# Patient Record
Sex: Female | Born: 1965 | Race: White | Hispanic: No | Marital: Married | Smoking: Never smoker
Health system: Southern US, Community
[De-identification: ages and names within clinical notes are randomized; demographics above are authoritative.]

## PROBLEM LIST (undated history)

## (undated) DIAGNOSIS — Z800 Family history of malignant neoplasm of digestive organs: Secondary | ICD-10-CM

## (undated) DIAGNOSIS — K589 Irritable bowel syndrome without diarrhea: Secondary | ICD-10-CM

## (undated) DIAGNOSIS — Z803 Family history of malignant neoplasm of breast: Secondary | ICD-10-CM

## (undated) HISTORY — DX: Family history of malignant neoplasm of breast: Z80.3

## (undated) HISTORY — PX: UTERINE SEPTUM RESECTION: SHX5386

## (undated) HISTORY — DX: Family history of malignant neoplasm of digestive organs: Z80.0

## (undated) HISTORY — DX: Irritable bowel syndrome without diarrhea: K58.9

---

## 1996-07-08 HISTORY — PX: CATARACT EXTRACTION: SUR2

## 1998-07-08 HISTORY — PX: DILATION AND CURETTAGE OF UTERUS: SHX78

## 2009-09-05 HISTORY — PX: BREAST BIOPSY: SHX20

## 2014-07-21 ENCOUNTER — Encounter: Payer: Self-pay | Admitting: *Deleted

## 2014-07-25 ENCOUNTER — Ambulatory Visit (INDEPENDENT_AMBULATORY_CARE_PROVIDER_SITE_OTHER): Payer: 59 | Admitting: General Surgery

## 2014-07-25 ENCOUNTER — Encounter: Payer: Self-pay | Admitting: General Surgery

## 2014-07-25 VITALS — BP 142/82 | HR 78 | Resp 15 | Ht 64.0 in | Wt 141.0 lb

## 2014-07-25 DIAGNOSIS — N644 Mastodynia: Secondary | ICD-10-CM

## 2014-07-25 NOTE — Progress Notes (Signed)
Patient ID: Carol Francis, female   DOB: 1966/01/31, 49 y.o.   MRN: [id number]  Chief Complaint  Patient presents with  . Other    HPI Carol Lombardnita M Swetz is a 49 y.o. female  Here for evaluation of left breast pain. She states she noticed this pain around 07/08/14. The pain has subsided since that time. It developed before her most recent menses, but did persist after the menses had ended. No lumps felt on self exam. Her most recent mammogram was performed on 10/28/13. The pain was located in the left breast behind the nipple. The pain was a shooting pain that came and went. No injuries to the breasts.   HPI  Past Medical History  Diagnosis Date  . IBS (irritable bowel syndrome)     Past Surgical History  Procedure Laterality Date  . Dilation and curettage of uterus  2000  . Cataract extraction  1998  . Breast biopsy Left 09/2009    Family History  Problem Relation Age of Onset  . Breast cancer Mother [id number]  . Cancer Paternal Uncle     colon    Social History History  Substance Use Topics  . Smoking status: Never Smoker   . Smokeless tobacco: Never Used  . Alcohol Use: No    Allergies  Allergen Reactions  . Other Swelling    [place]    No current outpatient prescriptions on file.   No current facility-administered medications for this visit.    Review of Systems Review of Systems  Constitutional: Negative.   Respiratory: Negative.   Cardiovascular: Negative.     Blood pressure 142/82, pulse 78, resp. rate 15, height 5\' 4"  (1.626 m), weight 141 lb (63.957 kg), last menstrual period 07/15/2014.  Physical Exam Physical Exam  Constitutional: She is oriented to person, place, and time. She appears well-developed and well-nourished.  Neck: Neck supple. No thyromegaly present.  Cardiovascular: Normal rate, regular rhythm and normal heart sounds.   No murmur heard. Pulmonary/Chest: Effort normal and breath sounds normal. Right breast exhibits no inverted nipple, no  mass, no nipple discharge, no skin change and no tenderness. Left breast exhibits no inverted nipple, no mass, no nipple discharge, no skin change and no tenderness.  Right breast larger than the left.   Lymphadenopathy:    She has no cervical adenopathy.    She has no axillary adenopathy.  Neurological: She is alert and oriented to person, place, and time.  Skin: Skin is warm and dry.    Data Reviewed The report of her 10/28/2013 mammogram reported extremely dense breast tissue. BI-RADS 1.  Assessment    Benign breast exam.    Plan    The patient was encouraged to call if she has recurrent symptoms. Follow up otherwise will be on an as needed basis.    Ref: Dr Arnold LongVan Dalen   Bond Grieshop W 07/26/2014, 8:14 PM

## 2014-07-25 NOTE — Patient Instructions (Signed)
Continue self breast exams. Call office for any new breast issues or concerns. 

## 2014-07-26 DIAGNOSIS — N644 Mastodynia: Secondary | ICD-10-CM | POA: Insufficient documentation

## 2015-11-20 ENCOUNTER — Ambulatory Visit
Admission: RE | Admit: 2015-11-20 | Discharge: 2015-11-20 | Disposition: A | Discharge status: Home / Self Care | Payer: 59 | Source: Ambulatory Visit | Attending: Family Medicine | Admitting: Family Medicine

## 2015-11-20 ENCOUNTER — Encounter: Payer: Self-pay | Admitting: Family Medicine

## 2015-11-20 ENCOUNTER — Ambulatory Visit (INDEPENDENT_AMBULATORY_CARE_PROVIDER_SITE_OTHER): Payer: 59 | Admitting: Family Medicine

## 2015-11-20 VITALS — BP 138/100 | HR 97 | Temp 98.1°F | Resp 16 | Wt 139.4 lb

## 2015-11-20 DIAGNOSIS — J0130 Acute sphenoidal sinusitis, unspecified: Secondary | ICD-10-CM | POA: Diagnosis not present

## 2015-11-20 DIAGNOSIS — J40 Bronchitis, not specified as acute or chronic: Secondary | ICD-10-CM

## 2015-11-20 MED ORDER — CEFDINIR 300 MG PO CAPS: 300.0000 mg | ORAL_CAPSULE | Freq: Two times a day (BID) | ORAL | Status: AC

## 2015-11-20 NOTE — Progress Notes (Signed)
Subjective:     Patient ID: Carol Francis, female   DOB: 1966/01/24, 50 y.o.   MRN: [id number]  HPI  Chief Complaint  Patient presents with  . Bronchitis    Patient comes into clinic today for follow up visit after being seen and evaluated at [facility] on 11/[facility]15 for cough. Patient reports for the past ten days she has had cough, body ache and chest congestion; for the pst 3 weeks patients reports that she has had symptoms of sinus pain and drainage. Patient reports that she was diagnosed [facility] with sinusitits and bronchitis, patient was prescribed Doxycyline. Patient is on last round of antibiotic and no improvement.  States on or about 5/6 she developed increased purulent sinus congestion, PND, body aches and increased chest congestion with cough. Reports she will be completing a 7 day course of the abx tomorrow with little improvement. Has been taking Sudafed for her sx. No hx of asthma.   Review of Systems     Objective:   Physical Exam  Constitutional: She appears well-developed and well-nourished. No distress.  Ears: T.M's intact without inflammation Sinuses: non-tender but localizes toward her parietal area. Throat: no tonsillar enlargement or exudate Neck: no cervical adenopathy Lungs: Bi-basilar coarse crackles.     Assessment:    1. Acute sphenoidal sinusitis, recurrence not specified - cefdinir (OMNICEF) 300 MG capsule; Take 1 capsule (300 mg total) by mouth 2 (two) times daily.  Dispense: 20 capsule; Refill: 0  2. Bronchitis - DG Chest 2 View; Future - cefdinir (OMNICEF) 300 MG capsule; Take 1 capsule (300 mg total) by mouth 2 (two) times daily.  Dispense: 20 capsule; Refill: 0    Plan:    Discussed otc medication. Further f/u pending x-ray report.

## 2015-11-20 NOTE — Patient Instructions (Signed)
Discussed use of Delsym for cough and expectorants. We will call you with the x-ray report.

## 2015-11-21 ENCOUNTER — Ambulatory Visit: Payer: Self-pay | Admitting: Family Medicine

## 2015-12-27 ENCOUNTER — Other Ambulatory Visit: Payer: Self-pay | Admitting: Obstetrics & Gynecology

## 2015-12-27 DIAGNOSIS — Z1231 Encounter for screening mammogram for malignant neoplasm of breast: Secondary | ICD-10-CM

## 2016-01-19 ENCOUNTER — Ambulatory Visit: Payer: 59

## 2016-01-29 ENCOUNTER — Ambulatory Visit: Payer: 59

## 2016-02-13 ENCOUNTER — Ambulatory Visit: Payer: 59

## 2016-02-20 ENCOUNTER — Other Ambulatory Visit: Payer: Self-pay | Admitting: Obstetrics & Gynecology

## 2016-02-20 ENCOUNTER — Ambulatory Visit
Admission: RE | Admit: 2016-02-20 | Discharge: 2016-02-20 | Disposition: A | Discharge status: Home / Self Care | Payer: 59 | Source: Ambulatory Visit | Attending: Obstetrics & Gynecology | Admitting: Obstetrics & Gynecology

## 2016-02-20 DIAGNOSIS — Z1231 Encounter for screening mammogram for malignant neoplasm of breast: Secondary | ICD-10-CM | POA: Diagnosis not present

## 2016-08-09 ENCOUNTER — Telehealth: Payer: Self-pay | Admitting: Family Medicine

## 2016-08-09 NOTE — Telephone Encounter (Signed)
Does sound like she is dehydrated, she should push fluids and see how she feels. She should finish the Tamiflu. She can try taking a Benadryl for the post nasal drip at night to stop her from coughing. Don't think we have appts today but if she feels significantly worse tomorrow she can make an appt for the half day clinic. But if she's not having fever, extremely productive cough with green sputum/nausea vomiting, sounds like it will run it's course. If she continue to feel dizzy and lightheaded and is unable to tolerate PO intake, she may need to go to the ER for IV fluids.

## 2016-08-09 NOTE — Telephone Encounter (Signed)
Patient states [facility] went to [facility] and was treated for flu and given Tamiflu. They did not check the test just treated her for her symptoms. At that time she had body ache, sore throat and was feeling better yesterday but then this morning when she woke up she felt worse. More achy and lightheaded. She did not get enough fluid in her yesterday she thinks. As I was speaking with patient she states she feels better now. Symptoms are post nasal drainage, runny nose, head congestion, cough slightly productive and mainly at night when she tries to lay down. She has been taking Ibuprofen and Sudafed also. She can not take Mucinex because it makes her dizzy. Is there anything else she needs to do or be seen again?-aa

## 2016-08-09 NOTE — Telephone Encounter (Signed)
[facility] and was Dx with the flu. Pt has been taking Tamaflu and was starting to feel better yesterday but today she feels worse and would like to speak with a nurse. Thanks TNP

## 2016-08-09 NOTE — Telephone Encounter (Signed)
Pt advised-aa 

## 2016-08-22 ENCOUNTER — Telehealth: Payer: Self-pay | Admitting: Family Medicine

## 2016-08-22 NOTE — Telephone Encounter (Signed)
Patient was advised as below and states that her friend who is a doctor suggested patient request CBC. I advised patient to come in office to be assessed first and any labs that need to be ordered can be done after visit. Appointment scheduled at 8:30 2/16.KW

## 2016-08-22 NOTE — Telephone Encounter (Addendum)
Triaged patient on the phone, she states that she was seen at [facility] two weeks ago and diagnosed with Flu virus and prescribed Tamiflu. Patient reports that she has finished medication but still has body aches, she states she didn't know if it was related to virus or not? Patient denies fever, reports dry cough  And drainage in back of her throat. Patient states at time she feels light headed but denies ear pain or dizziness. Patient has been taking otc Ibuprofen. Patient wants to know if these symptoms will pass since she had the flu or if she needs to be seen in office? KW  Patient request call back on her cellphone. KW

## 2016-08-22 NOTE — Telephone Encounter (Signed)
Pt was diagnosed with the flu 2 weeks ago.  She is still having body aches, a little cough but no fever.  Please advise [phone]  Thanks Barth Kirkseri

## 2016-08-22 NOTE — Telephone Encounter (Signed)
[id number] 

## 2016-08-22 NOTE — Telephone Encounter (Signed)
I have no information on the patient or on this [facility] visit. It sounds like she is getting over the flu. Would push fluids and Tylenol. One of the extenders can see her today or tomorrow if desired.

## 2016-08-23 ENCOUNTER — Ambulatory Visit: Payer: Self-pay | Admitting: Physician Assistant

## 2017-01-01 ENCOUNTER — Telehealth: Payer: Self-pay

## 2017-01-01 NOTE — Telephone Encounter (Signed)
Pt scheduled to be seen 01/02/17 8:30 with SDJ

## 2017-01-01 NOTE — Telephone Encounter (Signed)
Pt c/o vag itching.  Has had yeast inf in past but this isn't presenting like that.  Recently bought a house that has hard water and has been attributing sxs to that.  Now she has a green d/c and odor.  She got a one day tx of miconizole for tonight.  In the past she has to come in and get rx for terazol b/c monistat doesn't usually work for her.  Should she try this or be seen?  [phone]

## 2017-01-03 ENCOUNTER — Encounter: Payer: Self-pay | Admitting: Obstetrics and Gynecology

## 2017-01-03 ENCOUNTER — Telehealth: Payer: Self-pay | Admitting: Obstetrics and Gynecology

## 2017-01-03 ENCOUNTER — Ambulatory Visit (INDEPENDENT_AMBULATORY_CARE_PROVIDER_SITE_OTHER): Payer: 59 | Admitting: Obstetrics and Gynecology

## 2017-01-03 VITALS — BP 118/74 | Ht 64.0 in | Wt 138.0 lb

## 2017-01-03 DIAGNOSIS — B3731 Acute candidiasis of vulva and vagina: Secondary | ICD-10-CM

## 2017-01-03 DIAGNOSIS — B373 Candidiasis of vulva and vagina: Secondary | ICD-10-CM

## 2017-01-03 LAB — POCT WET PREP WITH KOH
Clue Cells Wet Prep HPF POC: NEGATIVE
KOH PREP POC: POSITIVE — AB
PH, VAGINAL: 5
TRICHOMONAS UA: NEGATIVE

## 2017-01-03 MED ORDER — TERCONAZOLE 0.4 % VA CREA: 1.0000 | TOPICAL_CREAM | Freq: Every day | VAGINAL | 0 refills | Status: AC

## 2017-01-03 NOTE — Telephone Encounter (Signed)
Pt is calling stating she just left from her appointment with us today and has a question about the prescription terrazol . Pt report she is suppose to start her mentrual cycle in the next couple of days please advise.

## 2017-01-03 NOTE — Progress Notes (Signed)
Obstetrics & Gynecology Office Visit   Chief Complaint  Patient presents with  . Vaginal Itching  . Vaginitis   History of Present Illness: Vaginitis: Patient complains of an abnormal vaginal discharge for 4 weeks. Vaginal symptoms include local irritation, odor and vulvar itching.Vulvar symptoms include local irritation, odor and vulvar itching.STI Risk: Very low risk of STD exposureDischarge described as: white, green and thick.Other associated symptoms: none.Menstrual pattern: She had been bleeding regularly. Contraception: none   Past Medical History:  Diagnosis Date  . IBS (irritable bowel syndrome)     Past Surgical History:  Procedure Laterality Date  . BREAST BIOPSY Left 09/2009  . CATARACT EXTRACTION  1998  . DILATION AND CURETTAGE OF UTERUS  2000    Gynecologic History: Patient's last menstrual period was 12/14/2016.  Obstetric History: [id number]  Family History  Problem Relation Age of Onset  . Breast cancer Mother [id number]  . Cancer Paternal Uncle        colon  . Heart disease Father   . Heart disease Brother     Social History   Social History  . Marital status: Married    Spouse name: N/A  . Number of children: N/A  . Years of education: N/A   Occupational History  . Not on file.   Social History Main Topics  . Smoking status: Never Smoker  . Smokeless tobacco: Never Used  . Alcohol use No  . Drug use: No  . Sexual activity: Yes    Birth control/ protection: None   Other Topics Concern  . Not on file   Social History Narrative  . No narrative on file    Allergies  Allergen Reactions  . Amitriptyline Hcl Other (See Comments)  . Other Swelling    [place]    Prior to Admission medications   Medication Sig Start Date End Date Taking? Authorizing Provider  cefdinir (OMNICEF) 300 MG capsule Take 1 capsule (300 mg total) by mouth 2 (two) times daily. Patient not taking: Reported on 01/03/2017 11/20/15   Anola Gurneyhauvin, Robert, PA  doxycycline  (VIBRA-TABS) 100 MG tablet TAKE 1 TABLET (100 MG TOTAL) BY MOUTH 2 (TWO) TIMES A DAY FOR 7 DAYS. NOT FOR PREGNANCY/LACTATION 11/14/15   [provider]  PREVIDENT 5000 SENSITIVE 1.1-5 % PSTE See admin instructions. 09/13/15   [provider]  psyllium (TGT PSYLLIUM FIBER) 0.52 g capsule Take by mouth.    [provider]  triamcinolone cream (KENALOG) 0.1 % APPLY TWICE DAILY TO RASH UNTIL CLEAR , THEN USE AS NEEDED FOR FLARES 09/13/15   [provider]    Review of Systems  Constitutional: Negative.   HENT: Negative.   Eyes: Negative.   Respiratory: Negative.   Cardiovascular: Negative.   Gastrointestinal: Negative.   Genitourinary: Negative.   Musculoskeletal: Negative.   Skin: Negative.   Neurological: Negative.   Psychiatric/Behavioral: Negative.      Physical Exam BP 118/74   Ht 5\' 4"  (1.626 m)   Wt 138 lb (62.6 kg)   LMP 12/14/2016   BMI 23.69 kg/m  Patient's last menstrual period was 12/14/2016. Physical Exam  Constitutional: She is oriented to person, place, and time. She appears well-developed and well-nourished. No distress.  Genitourinary: Uterus normal. Pelvic exam was performed with patient supine. There is no rash, tenderness, lesion or injury on the right labia. There is no rash, tenderness, lesion or injury on the left labia. Vagina exhibits no lesion. No tenderness or bleeding in the vagina. No signs of injury around  the vagina. Vaginal discharge found. Right adnexum does not display mass, does not display tenderness and does not display fullness. Left adnexum does not display mass, does not display tenderness and does not display fullness. Cervix does not exhibit motion tenderness, lesion, discharge or polyp.   Uterus is anteverted.  Eyes: EOM are normal. No scleral icterus.  Neck: Normal range of motion. Neck supple.  Cardiovascular: Normal rate and regular rhythm.   Pulmonary/Chest: Effort normal and breath sounds normal. No  respiratory distress. She has no wheezes. She has no rales.  Abdominal: Soft. Bowel sounds are normal. She exhibits no distension and no mass. There is no tenderness. There is no rebound and no guarding.  Musculoskeletal: Normal range of motion. She exhibits no edema.  Neurological: She is alert and oriented to person, place, and time. No cranial nerve deficit.  Skin: Skin is warm and dry. No erythema.  Psychiatric: She has a normal mood and affect. Her behavior is normal. Judgment normal.   Female chaperone present for pelvic and breast  portions of the physical exam  Wet Prep Trichomonas: negative Fungal elements: PRESENT Clue Cells: absent Vaginal pH: 5.0   Assessment: 51 y.o. [id number] female with candida vulvovaginitis  Plan: 1. Candidal vulvovaginitis - terconazole (TERAZOL 7) 0.4 % vaginal cream; Place 1 applicator vaginally at bedtime x 7 days.  Dispense: 45 g; Refill: 0 - POCT Wet Prep with KOH   Thomasene Mohair, MD 01/03/2017 8:53 AM

## 2017-01-03 NOTE — Telephone Encounter (Signed)
Pt is returning call wants to speak with Meriam SpragueBeverly about he medicine and would like you to call her cell phone

## 2017-01-03 NOTE — Telephone Encounter (Signed)
Trying to call pt, no answer. Please advise her when she calls back that this is okay and she still needs to take medication for issue. Thank you.

## 2017-01-04 ENCOUNTER — Other Ambulatory Visit: Payer: Self-pay | Admitting: Obstetrics and Gynecology

## 2017-01-04 DIAGNOSIS — B373 Candidiasis of vulva and vagina: Secondary | ICD-10-CM | POA: Insufficient documentation

## 2017-01-04 DIAGNOSIS — B3731 Acute candidiasis of vulva and vagina: Secondary | ICD-10-CM | POA: Insufficient documentation

## 2017-01-04 MED ORDER — TERCONAZOLE 0.8 % VA CREA: 1.0000 | TOPICAL_CREAM | Freq: Every day | VAGINAL | 0 refills | Status: AC

## 2017-01-20 ENCOUNTER — Ambulatory Visit (INDEPENDENT_AMBULATORY_CARE_PROVIDER_SITE_OTHER): Payer: 59 | Admitting: Obstetrics & Gynecology

## 2017-01-20 ENCOUNTER — Encounter: Payer: Self-pay | Admitting: Obstetrics & Gynecology

## 2017-01-20 VITALS — BP 130/90 | HR 67 | Ht 64.0 in | Wt 138.0 lb

## 2017-01-20 DIAGNOSIS — Z1211 Encounter for screening for malignant neoplasm of colon: Secondary | ICD-10-CM

## 2017-01-20 DIAGNOSIS — Z01419 Encounter for gynecological examination (general) (routine) without abnormal findings: Secondary | ICD-10-CM | POA: Diagnosis not present

## 2017-01-20 DIAGNOSIS — Z0000 Encounter for general adult medical examination without abnormal findings: Secondary | ICD-10-CM

## 2017-01-20 NOTE — Patient Instructions (Signed)
PAP every three years Mammogram every year Colonoscopy every 5 or 10 years Labs yearly (with PCP or work)

## 2017-01-20 NOTE — Progress Notes (Signed)
   HPI:      [name] is a 51 y.o. [id number] who LMP was Patient's last menstrual period was 01/08/2017., she presents today for her annual examination. The patient has no complaints today. The patient is sexually active. Her last pap: approximate date 2017 and was normal and last mammogram: was normal. The patient does perform self breast exams.  There is notable family history of breast or ovarian cancer in her family.  The patient has regular exercise: yes.  The patient denies current symptoms of depression.    GYN History: Contraception: vasectomy  PMHx: Past Medical History:  Diagnosis Date  . IBS (irritable bowel syndrome)    Past Surgical History:  Procedure Laterality Date  . BREAST BIOPSY Left 09/2009  . CATARACT EXTRACTION  1998  . DILATION AND CURETTAGE OF UTERUS  2000  . UTERINE SEPTUM RESECTION     Family History  Problem Relation Age of Onset  . Breast cancer Mother [id number]  . Fibromyalgia Mother   . Hypertension Mother   . Osteoporosis Mother   . Cancer Paternal Uncle        colon  . Heart disease Father   . Heart attack Father   . Stroke Father   . Heart disease Brother   . Hypertension Brother    Social History  Substance Use Topics  . Smoking status: Never Smoker  . Smokeless tobacco: Never Used  . Alcohol use No    Current Outpatient Prescriptions:  .  cefdinir (OMNICEF) 300 MG capsule, Take 1 capsule (300 mg total) by mouth 2 (two) times daily. (Patient not taking: Reported on 01/03/2017), Disp: 20 capsule, Rfl: 0 .  doxycycline (VIBRA-TABS) 100 MG tablet, TAKE 1 TABLET (100 MG TOTAL) BY MOUTH 2 (TWO) TIMES A DAY FOR 7 DAYS. NOT FOR PREGNANCY/LACTATION, Disp: , Rfl: 0 .  PREVIDENT 5000 SENSITIVE 1.1-5 % PSTE, See admin instructions., Disp: , Rfl: 1 .  psyllium (TGT PSYLLIUM FIBER) 0.52 g capsule, Take by mouth., Disp: , Rfl:  .  triamcinolone cream (KENALOG) 0.1 %, APPLY TWICE DAILY TO RASH UNTIL CLEAR , THEN USE AS NEEDED FOR FLARES, Disp: , Rfl:  1 Allergies: Amitriptyline hcl and Other  ROS  Objective: BP 130/90   Pulse 67   Ht 5\' 4"  (1.626 m)   Wt 138 lb (62.6 kg)   LMP 01/08/2017   BMI 23.69 kg/m   Filed Weights   01/20/17 1512  Weight: 138 lb (62.6 kg)   Body mass index is 23.69 kg/m. OBGyn Exam  Assessment:  ANNUAL EXAM 1. Annual physical exam   2. Screen for colon cancer      Screening Plan:            1.  Cervical Screening-  Pap smear schedule reviewed with patient, due 2020  2. Breast screening- Exam annually and mammogram>40 planned   3. Colonoscopy every 10 years, Hemoccult testing - after age 49  4. Labs done thru work, normal chol and other labs per pt  5. Counseling for contraception: vasectomy  Other:  1. Annual physical exam  2. Screen for colon cancer - Fecal occult blood, imunochemical  3. Joint pain, activity dependant it seems.  OA also discussed.  Exercise and even PT consult discussed.    F/U  Return in about 1 year (around 01/20/2018) for Annual.  Annamarie MajorPaul Harris, MD, Merlinda FrederickFACOG [facility], [facility] 01/20/2017  3:43 PM

## 2017-03-03 ENCOUNTER — Encounter: Payer: Self-pay | Admitting: Obstetrics & Gynecology

## 2017-06-27 ENCOUNTER — Encounter: Payer: Self-pay | Admitting: Advanced Practice Midwife

## 2017-06-27 ENCOUNTER — Ambulatory Visit (INDEPENDENT_AMBULATORY_CARE_PROVIDER_SITE_OTHER): Payer: 59 | Admitting: Advanced Practice Midwife

## 2017-06-27 VITALS — BP 138/98 | HR 55 | Ht 64.0 in | Wt 140.0 lb

## 2017-06-27 DIAGNOSIS — B3731 Acute candidiasis of vulva and vagina: Secondary | ICD-10-CM

## 2017-06-27 DIAGNOSIS — B373 Candidiasis of vulva and vagina: Secondary | ICD-10-CM | POA: Diagnosis not present

## 2017-06-27 MED ORDER — TERCONAZOLE 0.4 % VA CREA: 1.0000 | TOPICAL_CREAM | Freq: Every day | VAGINAL | 1 refills | Status: DC

## 2017-06-27 NOTE — Progress Notes (Signed)
S: The patient is here today with complaint of vaginal itching. She first noticed the symptoms 1 week ago of itching and a little bit of burning externally. She thinks she may have had a slight increase in her usual amount of discharge. She denies odor or burning with urination. She has used a previous prescription of Terazol 3 for the past 3 nights with some improvement but she is still having symptoms. She has also previously used a 7 day treatment with good results. Today she is requesting an Rx for Terconazole 7 day treatment. Diflucan has not worked for her in the past.   O: BP (!) 138/98   Pulse (!) 55   Ht 5\' 4"  (1.626 m)   Wt 140 lb (63.5 kg)   LMP 06/15/2017   BMI 24.03 kg/m   No exam done due to patient having used the Terconazole cream for the past 3 nights.  A: 51 yo female with vaginal yeast infection  P: Return to clinic PRN for worsening symptoms OTC and other comfort measures for yeast symptoms  Tresea MallJane Sloan Galentine, CNM

## 2017-12-25 ENCOUNTER — Other Ambulatory Visit: Payer: Self-pay | Admitting: Gastroenterology

## 2017-12-25 DIAGNOSIS — R1011 Right upper quadrant pain: Secondary | ICD-10-CM

## 2017-12-29 ENCOUNTER — Ambulatory Visit
Admission: RE | Admit: 2017-12-29 | Discharge: 2017-12-29 | Disposition: A | Discharge status: Home / Self Care | Payer: Managed Care, Other (non HMO) | Source: Ambulatory Visit | Attending: Gastroenterology | Admitting: Gastroenterology

## 2017-12-29 DIAGNOSIS — R748 Abnormal levels of other serum enzymes: Secondary | ICD-10-CM | POA: Diagnosis present

## 2017-12-29 DIAGNOSIS — R1011 Right upper quadrant pain: Secondary | ICD-10-CM | POA: Diagnosis present

## 2018-01-14 ENCOUNTER — Telehealth: Payer: Self-pay

## 2018-01-14 NOTE — Telephone Encounter (Signed)
Spoke w/pt. Chart reviewed. No pap done last year. Advised pt of Standards of care q3year paps after 50yo if no h/o abn. Pt requesting pap annually. OB/GYN [facility](Blue Sticky Note) added to document for future reference.

## 2018-01-14 NOTE — Telephone Encounter (Signed)
Pt inquiring if she had or qualified for cervical screening last year. She is eligible for contributions to her health care account for wellness screenings. She did not receive her contribution last year. She is wanting to clarify. [id number]#[phone]

## 2018-02-04 ENCOUNTER — Encounter: Payer: Self-pay | Admitting: Obstetrics & Gynecology

## 2018-02-04 ENCOUNTER — Ambulatory Visit (INDEPENDENT_AMBULATORY_CARE_PROVIDER_SITE_OTHER): Payer: Managed Care, Other (non HMO) | Admitting: Obstetrics & Gynecology

## 2018-02-04 VITALS — BP 128/80 | Ht 64.0 in | Wt 138.0 lb

## 2018-02-04 DIAGNOSIS — Z1231 Encounter for screening mammogram for malignant neoplasm of breast: Secondary | ICD-10-CM

## 2018-02-04 DIAGNOSIS — Z124 Encounter for screening for malignant neoplasm of cervix: Secondary | ICD-10-CM

## 2018-02-04 DIAGNOSIS — Z01419 Encounter for gynecological examination (general) (routine) without abnormal findings: Secondary | ICD-10-CM

## 2018-02-04 DIAGNOSIS — Z803 Family history of malignant neoplasm of breast: Secondary | ICD-10-CM | POA: Diagnosis not present

## 2018-02-04 DIAGNOSIS — Z1211 Encounter for screening for malignant neoplasm of colon: Secondary | ICD-10-CM

## 2018-02-04 DIAGNOSIS — Z0000 Encounter for general adult medical examination without abnormal findings: Secondary | ICD-10-CM

## 2018-02-04 DIAGNOSIS — Z1239 Encounter for other screening for malignant neoplasm of breast: Secondary | ICD-10-CM

## 2018-02-04 NOTE — Patient Instructions (Addendum)
PAP every one to three years Mammogram every year    Call [phone] [facility] Colonoscopy every 5 years Labs yearly (with PCP)

## 2018-02-04 NOTE — Progress Notes (Signed)
HPI:      [name] is a 52 y.o. [id number] who LMP was in the past, she presents today for her annual examination.  The patient has no complaints today.   Reg periods usually. The patient is sexually active. Herlast pap: approximate date 2018 and was normal and last mammogram: approximate date 2018 and was normal.  The patient does not perform self breast exams.  There is notable family history of breast or ovarian cancer in her family. The patient is not taking hormone replacement therapy. Patient denies post-menopausal vaginal bleeding.   The patient has regular exercise: yes. The patient denies current symptoms of depression.    GYN Hx: Last Colonoscopy:never ago.   PMHx: Past Medical History:  Diagnosis Date  . IBS (irritable bowel syndrome)    Past Surgical History:  Procedure Laterality Date  . BREAST BIOPSY Left 09/2009  . CATARACT EXTRACTION  1998  . DILATION AND CURETTAGE OF UTERUS  2000  . UTERINE SEPTUM RESECTION     Family History  Problem Relation Age of Onset  . Breast cancer Mother 82  . Fibromyalgia Mother   . Hypertension Mother   . Osteoporosis Mother   . Cancer Paternal Uncle        colon  . Heart disease Father   . Heart attack Father   . Stroke Father   . Heart disease Brother   . Hypertension Brother    Social History   Tobacco Use  . Smoking status: Never Smoker  . Smokeless tobacco: Never Used  Substance Use Topics  . Alcohol use: No    Alcohol/week: 0.0 oz  . Drug use: No    Current Outpatient Medications:  .  cefdinir (OMNICEF) 300 MG capsule, Take 1 capsule (300 mg total) by mouth 2 (two) times daily., Disp: 20 capsule, Rfl: 0 .  PREVIDENT 5000 SENSITIVE 1.1-5 % PSTE, See admin instructions., Disp: , Rfl: 1 .  psyllium (TGT PSYLLIUM FIBER) 0.52 g capsule, Take by mouth., Disp: , Rfl:  .  terconazole (TERAZOL 7) 0.4 % vaginal cream, Place 1 applicator vaginally at bedtime., Disp: 45 g, Rfl: 1 .  triamcinolone cream (KENALOG) 0.1 %,  APPLY TWICE DAILY TO RASH UNTIL CLEAR , THEN USE AS NEEDED FOR FLARES, Disp: , Rfl: 1 Allergies: Amitriptyline hcl and Other  Review of Systems  Constitutional: Negative for chills, fever and malaise/fatigue.  HENT: Negative for congestion, sinus pain and sore throat.   Eyes: Negative for blurred vision and pain.  Respiratory: Negative for cough and wheezing.   Cardiovascular: Negative for chest pain and leg swelling.  Gastrointestinal: Negative for abdominal pain, constipation, diarrhea, heartburn, nausea and vomiting.  Genitourinary: Negative for dysuria, frequency, hematuria and urgency.  Musculoskeletal: Negative for back pain, joint pain, myalgias and neck pain.  Skin: Negative for itching and rash.  Neurological: Negative for dizziness, tremors and weakness.  Endo/Heme/Allergies: Does not bruise/bleed easily.  Psychiatric/Behavioral: Negative for depression. The patient is not nervous/anxious and does not have insomnia.     Objective: BP 128/80   Ht 5\' 4"  (1.626 m)   Wt 138 lb (62.6 kg)   LMP 01/14/2018   BMI 23.69 kg/m   Filed Weights   02/04/18 1529  Weight: 138 lb (62.6 kg)   Body mass index is 23.69 kg/m. Physical Exam  Constitutional: She is oriented to person, place, and time. She appears well-developed and well-nourished. No distress.  Genitourinary: Rectum normal, vagina normal and uterus normal. Pelvic exam was performed  with patient supine. There is no rash or lesion on the right labia. There is no rash or lesion on the left labia. Vagina exhibits no lesion. No bleeding in the vagina. Right adnexum does not display mass and does not display tenderness. Left adnexum does not display mass and does not display tenderness. Cervix does not exhibit motion tenderness, lesion, friability or polyp.   Uterus is mobile and midaxial. Uterus is not enlarged or exhibiting a mass.  HENT:  Head: Normocephalic and atraumatic. Head is without laceration.  Right Ear: Hearing normal.   Left Ear: Hearing normal.  Nose: No epistaxis.  No foreign bodies.  Mouth/Throat: Uvula is midline, oropharynx is clear and moist and mucous membranes are normal.  Eyes: Pupils are equal, round, and reactive to light.  Neck: Normal range of motion. Neck supple. No thyromegaly present.  Cardiovascular: Normal rate and regular rhythm. Exam reveals no gallop and no friction rub.  No murmur heard. Pulmonary/Chest: Effort normal and breath sounds normal. No respiratory distress. She has no wheezes. Right breast exhibits no mass, no skin change and no tenderness. Left breast exhibits no mass, no skin change and no tenderness.  Abdominal: Soft. Bowel sounds are normal. She exhibits no distension. There is no tenderness. There is no rebound.  Musculoskeletal: Normal range of motion.  Neurological: She is alert and oriented to person, place, and time. No cranial nerve deficit.  Skin: Skin is warm and dry.  Psychiatric: She has a normal mood and affect. Judgment normal.  Vitals reviewed.  Assessment: Annual Exam 1. Annual physical exam   2. Screening for cervical cancer   3. Screening for breast cancer   4. Screen for colon cancer    Plan:            1.  Cervical Screening-  Pap smear done today  2. Breast screening- Exam annually and mammogram scheduled 3D due to breast density history  3. Colonoscopy every 5 years ([facility]), Hemoccult testing after age 50  4. Labs managed by PCP  5. Counseling for hormonal therapy: none.  Monitor for s/sx menopause.     F/U  Return in about 1 year (around 02/05/2019) for Annual.  Annamarie MajorPaul Avaleen Brownley, MD, Merlinda FrederickFACOG [facility], [facility] 02/04/2018  4:03 PM

## 2018-02-11 LAB — PAP IG (IMAGE GUIDED): PAP Smear Comment: 0

## 2018-02-20 ENCOUNTER — Other Ambulatory Visit: Payer: Self-pay | Admitting: Obstetrics & Gynecology

## 2018-02-20 DIAGNOSIS — R195 Other fecal abnormalities: Secondary | ICD-10-CM

## 2018-02-20 LAB — FECAL OCCULT BLOOD, IMMUNOCHEMICAL: Fecal Occult Bld: POSITIVE — AB

## 2018-02-20 NOTE — Progress Notes (Signed)
Positive Hemoccult stool test for heme, rec GI referral Past colonoscopy 3 years ago w Dr Markham JordanElliot Referral requested D/w pt  Annamarie MajorPaul Ardian Haberland, MD, Merlinda FrederickFACOG [facility], [facility] 02/20/2018  11:57 AM

## 2018-03-04 ENCOUNTER — Ambulatory Visit
Admission: RE | Admit: 2018-03-04 | Discharge: 2018-03-04 | Disposition: A | Discharge status: Home / Self Care | Payer: Managed Care, Other (non HMO) | Source: Ambulatory Visit | Attending: Obstetrics & Gynecology | Admitting: Obstetrics & Gynecology

## 2018-03-04 DIAGNOSIS — Z1239 Encounter for other screening for malignant neoplasm of breast: Secondary | ICD-10-CM

## 2018-03-04 DIAGNOSIS — Z1231 Encounter for screening mammogram for malignant neoplasm of breast: Secondary | ICD-10-CM | POA: Insufficient documentation

## 2018-05-15 ENCOUNTER — Encounter: Payer: Self-pay | Admitting: Family Medicine

## 2018-11-25 ENCOUNTER — Telehealth: Payer: Self-pay

## 2018-11-25 NOTE — Telephone Encounter (Signed)
Pt is requesting RF on XANAX 0.25 mg. She says last time she had it prescribed was about four years ago by Dr. Arvil Chaco (30 tablets). She has a lot of pressure in her life now, can you please RF?

## 2018-11-25 NOTE — Telephone Encounter (Signed)
I have asked Raynelle Fanning to call pt to schedule.

## 2018-11-25 NOTE — Telephone Encounter (Signed)
I have not seen this patient for nearly 2 years. She last saw Dr. Tiburcio Pea. I am happy to do a telephone appt with her and prescribe the medication, if it seems like it would be beneficial.  But, I can not prescribe her a medication for something for which I have never evaluated her.  Please let her know and see if she'd like to do the phone appt.

## 2018-11-25 NOTE — Telephone Encounter (Signed)
Patient scheduled 5/26 for phone visit with SDJ

## 2018-12-01 ENCOUNTER — Encounter: Payer: Self-pay | Admitting: Obstetrics and Gynecology

## 2018-12-01 ENCOUNTER — Other Ambulatory Visit: Payer: Self-pay

## 2018-12-01 ENCOUNTER — Ambulatory Visit (INDEPENDENT_AMBULATORY_CARE_PROVIDER_SITE_OTHER): Payer: Managed Care, Other (non HMO) | Admitting: Obstetrics and Gynecology

## 2018-12-01 VITALS — Ht 64.0 in | Wt 139.0 lb

## 2018-12-01 DIAGNOSIS — F411 Generalized anxiety disorder: Secondary | ICD-10-CM

## 2018-12-01 MED ORDER — ALPRAZOLAM 0.25 MG PO TABS: 0.2500 mg | ORAL_TABLET | Freq: Every day | ORAL | 0 refills | Status: DC | PRN

## 2018-12-01 NOTE — Progress Notes (Signed)
    Virtual Visit via Telephone Note  I connected with Carol Francis on 12/01/18 at  9:50 AM EDT by telephone and verified that I am speaking with the correct person using two identifiers.   I discussed the limitations, risks, security and privacy concerns of performing an evaluation and management service by telephone and the availability of in person appointments. I also discussed with the patient that there may be a patient responsible charge related to this service. The patient expressed understanding and agreed to proceed.  The patient was at home I spoke with the patient from my  office The names of people involved in this encounter were: Carol Francis and Thomasene Mohair, MD.   History of Present Illness: 53 y.o. [phone] female who presents with anxiety issues.  She has had some anxiety issues on and off since her second child.  She has taken the lowest dose and the last time she had a prescription was in 2016 and still has a few left. She has some new life stressors. Her child is home from college. Her younger child is in high school. She has a new boss and is transitioning to working from home. 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  She has had some mild panic-attack like symptoms.  She has also had difficulty sleeping due to racing thoughts.  She normally doesn't take the medication during the daytime.    GAD-7: 9   Observations/Objective: Physical Exam could not be performed. Because of the COVID-19 outbreak this visit was performed over the phone and not in person.   Assessment and Plan: 1. Generalized anxiety disorder   Rx for Xanax  Will give Trazodone, if she finds she is using the medication for sleep frequently.  PMDP checked and there are no recent prescriptions for any benzodiazepines for her.    Follow Up Instructions: Routine annual in July or as needed.    I discussed the assessment and treatment plan with the patient. The  patient was provided an opportunity to ask questions and all were answered. The patient agreed with the plan and demonstrated an understanding of the instructions.   The patient was advised to call back or seek an in-person evaluation if the symptoms worsen or if the condition fails to improve as anticipated.  I provided 18 minutes of non-face-to-face time during this encounter.  Thomasene Mohair, MD  [facility], [facility] 12/01/2018 9:26 AM

## 2019-03-03 ENCOUNTER — Other Ambulatory Visit: Payer: Self-pay | Admitting: Family Medicine

## 2019-03-03 ENCOUNTER — Other Ambulatory Visit: Payer: Self-pay

## 2019-03-03 ENCOUNTER — Other Ambulatory Visit: Payer: Self-pay | Admitting: Obstetrics & Gynecology

## 2019-03-03 DIAGNOSIS — Z1231 Encounter for screening mammogram for malignant neoplasm of breast: Secondary | ICD-10-CM

## 2019-03-24 ENCOUNTER — Encounter: Payer: Self-pay | Admitting: Obstetrics & Gynecology

## 2019-03-24 ENCOUNTER — Other Ambulatory Visit: Payer: Self-pay

## 2019-03-24 ENCOUNTER — Ambulatory Visit (INDEPENDENT_AMBULATORY_CARE_PROVIDER_SITE_OTHER): Payer: Managed Care, Other (non HMO) | Admitting: Obstetrics & Gynecology

## 2019-03-24 VITALS — BP 140/90 | Ht 64.0 in | Wt 139.0 lb

## 2019-03-24 DIAGNOSIS — Z124 Encounter for screening for malignant neoplasm of cervix: Secondary | ICD-10-CM

## 2019-03-24 DIAGNOSIS — Z1239 Encounter for other screening for malignant neoplasm of breast: Secondary | ICD-10-CM

## 2019-03-24 DIAGNOSIS — Z1211 Encounter for screening for malignant neoplasm of colon: Secondary | ICD-10-CM

## 2019-03-24 DIAGNOSIS — Z01419 Encounter for gynecological examination (general) (routine) without abnormal findings: Secondary | ICD-10-CM | POA: Diagnosis not present

## 2019-03-24 MED ORDER — TRIAMCINOLONE ACETONIDE 0.1 % EX CREA: 1.0000 "application " | TOPICAL_CREAM | Freq: Two times a day (BID) | CUTANEOUS | 1 refills | Status: DC

## 2019-03-24 NOTE — Progress Notes (Signed)
HPI:      [name] is a 53 y.o. [id number] who LMP was Patient's last menstrual period was 01/16/2019., she presents today for her annual examination. The patient has no complaints today. Periods were regular until 6 mos ago, spacing out.  Rare night sweat.  Occas vaginal dryness, no dyspareunia. The patient is sexually active. Her last pap: approximate date 2019 and was normal and last mammogram: approximate date 2019 and was normal. The patient does perform self breast exams.  There is notable family history of breast or ovarian cancer in her family.  The patient has regular exercise: yes.  The patient denies current symptoms of depression.    GYN History: Contraception: vasectomy  Colonoscopy last year normal  PMHx: Past Medical History:  Diagnosis Date  . IBS (irritable bowel syndrome)    Past Surgical History:  Procedure Laterality Date  . BREAST BIOPSY Left 09/2009   benign  . CATARACT EXTRACTION  1998  . DILATION AND CURETTAGE OF UTERUS  2000  . UTERINE SEPTUM RESECTION     Family History  Problem Relation Age of Onset  . Breast cancer Mother 1149  . Fibromyalgia Mother   . Hypertension Mother   . Osteoporosis Mother   . Cancer Paternal Uncle        colon  . Heart disease Father   . Heart attack Father   . Stroke Father   . Heart disease Brother   . Hypertension Brother    Social History   Tobacco Use  . Smoking status: Never Smoker  . Smokeless tobacco: Never Used  Substance Use Topics  . Alcohol use: No    Alcohol/week: 0.0 standard drinks  . Drug use: No    Current Outpatient Medications:  .  ALPRAZolam (XANAX) 0.25 MG tablet, Take 1 tablet (0.25 mg total) by mouth daily as needed for anxiety. (Patient not taking: Reported on 03/24/2019), Disp: 30 tablet, Rfl: 0 .  cefdinir (OMNICEF) 300 MG capsule, Take 1 capsule (300 mg total) by mouth 2 (two) times daily. (Patient not taking: Reported on 12/01/2018), Disp: 20 capsule, Rfl: 0 .  PREVIDENT 5000  SENSITIVE 1.1-5 % PSTE, See admin instructions., Disp: , Rfl: 1 .  psyllium (TGT PSYLLIUM FIBER) 0.52 g capsule, Take by mouth., Disp: , Rfl:  .  terconazole (TERAZOL 7) 0.4 % vaginal cream, Place 1 applicator vaginally at bedtime. (Patient not taking: Reported on 12/01/2018), Disp: 45 g, Rfl: 1 .  triamcinolone cream (KENALOG) 0.1 %, Apply 1 application topically 2 (two) times daily., Disp: 30 g, Rfl: 1 Allergies: Amitriptyline hcl and Other  Review of Systems  Constitutional: Negative for chills, fever and malaise/fatigue.  HENT: Negative for congestion, sinus pain and sore throat.   Eyes: Negative for blurred vision and pain.  Respiratory: Negative for cough and wheezing.   Cardiovascular: Negative for chest pain and leg swelling.  Gastrointestinal: Negative for abdominal pain, constipation, diarrhea, heartburn, nausea and vomiting.  Genitourinary: Negative for dysuria, frequency, hematuria and urgency.  Musculoskeletal: Negative for back pain, joint pain, myalgias and neck pain.  Skin: Negative for itching and rash.  Neurological: Negative for dizziness, tremors and weakness.  Endo/Heme/Allergies: Does not bruise/bleed easily.  Psychiatric/Behavioral: Negative for depression. The patient is not nervous/anxious and does not have insomnia.     Objective: BP 140/90   Ht 5\' 4"  (1.626 m)   Wt 139 lb (63 kg)   LMP 01/16/2019   BMI 23.86 kg/m   [employer]iled Weights  03/24/19 1439  Weight: 139 lb (63 kg)   Body mass index is 23.86 kg/m. Physical Exam Constitutional:      General: She is not in acute distress.    Appearance: She is well-developed.  Genitourinary:     Pelvic exam was performed with patient supine.     Vagina, uterus and rectum normal.     No lesions in the vagina.     No vaginal bleeding.     No cervical motion tenderness, friability, lesion or polyp.     Uterus is mobile.     Uterus is not enlarged.     No uterine mass detected.    Uterus is midaxial.     No right  or left adnexal mass present.     Right adnexa not tender.     Left adnexa not tender.  HENT:     Head: Normocephalic and atraumatic. No laceration.     Right Ear: Hearing normal.     Left Ear: Hearing normal.     Mouth/Throat:     Pharynx: Uvula midline.  Eyes:     Pupils: Pupils are equal, round, and reactive to light.  Neck:     Musculoskeletal: Normal range of motion and neck supple.     Thyroid: No thyromegaly.  Cardiovascular:     Rate and Rhythm: Normal rate and regular rhythm.     Heart sounds: No murmur. No friction rub. No gallop.   Pulmonary:     Effort: Pulmonary effort is normal. No respiratory distress.     Breath sounds: Normal breath sounds. No wheezing.  Chest:     Breasts:        Right: No mass, skin change or tenderness.        Left: No mass, skin change or tenderness.  Abdominal:     General: Bowel sounds are normal. There is no distension.     Palpations: Abdomen is soft.     Tenderness: There is no abdominal tenderness. There is no rebound.  Musculoskeletal: Normal range of motion.  Neurological:     Mental Status: She is alert and oriented to person, place, and time.     Cranial Nerves: No cranial nerve deficit.  Skin:    General: Skin is warm and dry.  Psychiatric:        Judgment: Judgment normal.  Vitals signs reviewed.     Assessment:  ANNUAL EXAM 1. Women's annual routine gynecological examination   2. Screening for cervical cancer   3. Screening for breast cancer   4. Screen for colon cancer      Screening Plan:            1.  Cervical Screening-  Pap smear done today  2. Breast screening- Exam annually and mammogram planned Sept 25  3. Colonoscopy every 5 years, Hemoccult testing - after age 81  4. Labs managed by PCP  5. Counseling for contraception: vasectomy   6. Replens for vaginal dryness. Monitor for other menopausal sx's.     F/U  Return in about 1 year (around 03/23/2020) for Annual.  Barnett Applebaum, MD, [facility], [facility] 03/24/2019  3:16 PM

## 2019-03-24 NOTE — Patient Instructions (Signed)
PAP every 1-3 years Mammogram every year    Call [phone] to schedule at [facility] Colonoscopy every 5 years Labs yearly (with PCP)  Replens for vaginal dryness, twice weekly

## 2019-03-27 LAB — PAP IG (IMAGE GUIDED)

## 2019-04-02 ENCOUNTER — Ambulatory Visit
Admission: RE | Admit: 2019-04-02 | Discharge: 2019-04-02 | Disposition: A | Discharge status: Home / Self Care | Payer: Managed Care, Other (non HMO) | Source: Ambulatory Visit | Attending: Obstetrics & Gynecology | Admitting: Obstetrics & Gynecology

## 2019-04-02 DIAGNOSIS — Z1231 Encounter for screening mammogram for malignant neoplasm of breast: Secondary | ICD-10-CM | POA: Diagnosis present

## 2020-02-08 ENCOUNTER — Other Ambulatory Visit: Payer: Self-pay | Admitting: Obstetrics & Gynecology

## 2020-03-21 ENCOUNTER — Other Ambulatory Visit: Payer: Self-pay | Admitting: Obstetrics & Gynecology

## 2020-03-21 DIAGNOSIS — Z1231 Encounter for screening mammogram for malignant neoplasm of breast: Secondary | ICD-10-CM

## 2020-03-27 ENCOUNTER — Ambulatory Visit (INDEPENDENT_AMBULATORY_CARE_PROVIDER_SITE_OTHER): Payer: Managed Care, Other (non HMO) | Admitting: Obstetrics & Gynecology

## 2020-03-27 ENCOUNTER — Other Ambulatory Visit: Payer: Self-pay

## 2020-03-27 ENCOUNTER — Other Ambulatory Visit (HOSPITAL_COMMUNITY)
Admission: RE | Admit: 2020-03-27 | Discharge: 2020-03-27 | Disposition: A | Discharge status: Home / Self Care | Payer: Managed Care, Other (non HMO) | Source: Ambulatory Visit | Attending: Obstetrics & Gynecology | Admitting: Obstetrics & Gynecology

## 2020-03-27 ENCOUNTER — Encounter: Payer: Self-pay | Admitting: Obstetrics & Gynecology

## 2020-03-27 VITALS — BP 140/98 | Ht 64.0 in | Wt 136.0 lb

## 2020-03-27 DIAGNOSIS — Z1231 Encounter for screening mammogram for malignant neoplasm of breast: Secondary | ICD-10-CM

## 2020-03-27 DIAGNOSIS — Z1211 Encounter for screening for malignant neoplasm of colon: Secondary | ICD-10-CM

## 2020-03-27 DIAGNOSIS — Z124 Encounter for screening for malignant neoplasm of cervix: Secondary | ICD-10-CM | POA: Diagnosis not present

## 2020-03-27 DIAGNOSIS — Z01419 Encounter for gynecological examination (general) (routine) without abnormal findings: Secondary | ICD-10-CM | POA: Diagnosis not present

## 2020-03-27 DIAGNOSIS — N952 Postmenopausal atrophic vaginitis: Secondary | ICD-10-CM

## 2020-03-27 DIAGNOSIS — E7800 Pure hypercholesterolemia, unspecified: Secondary | ICD-10-CM

## 2020-03-27 DIAGNOSIS — R1900 Intra-abdominal and pelvic swelling, mass and lump, unspecified site: Secondary | ICD-10-CM

## 2020-03-27 MED ORDER — REPLENS VA GEL: 1.0000 | VAGINAL | 11 refills | Status: DC

## 2020-03-27 NOTE — Patient Instructions (Addendum)
PAP every year Mammogram every year    Call [phone] to schedule at [facility] Colonoscopy every 10 years Labs follow up Nov  Thank you for choosing [facility]. As part of our ongoing efforts to improve patient experience, we would appreciate your feedback. Please fill out the short survey that you will receive by mail or MyChart. Your opinion is important to [place]! - Dr. Tiburcio Pea   Atrophic Vaginitis  Atrophic vaginitis is a condition in which the tissues that line the vagina become dry and thin. This condition is most common in women who have stopped having regular menstrual periods (are in menopause). This usually starts when a woman is 42-72 years old. That is the time when a woman's estrogen levels begin to drop (decrease). Estrogen is a female hormone. It helps to keep the tissues of the vagina moist. It stimulates the vagina to produce a clear fluid that lubricates the vagina for sexual intercourse. This fluid also protects the vagina from infection. Lack of estrogen can cause the lining of the vagina to get thinner and dryer. The vagina may also shrink in size. It may become less elastic. Atrophic vaginitis tends to get worse over time as a woman's estrogen level drops. What are the causes? This condition is caused by the normal drop in estrogen that happens around the time of menopause. What increases the risk? Certain conditions or situations may lower a woman's estrogen level, leading to a higher risk for atrophic vaginitis. You are more likely to develop this condition if:  You are taking medicines that block estrogen.  You have had your ovaries removed.  You are being treated for cancer with X-ray (radiation) or medicines (chemotherapy).  You have given birth or are breastfeeding.  You are older than age 39.  You smoke. What are the signs or symptoms? Symptoms of this condition include:  Pain, soreness, or bleeding during sexual intercourse (dyspareunia).  Vaginal  burning, irritation, or itching.  Pain or bleeding when a speculum is used in a vaginal exam (pelvic exam).  Having burning pain when passing urine.  Vaginal discharge that is brown or yellow. In some cases, there are no symptoms. How is this diagnosed? This condition is diagnosed by taking a medical history and doing a physical exam. This will include a pelvic exam that checks the vaginal tissues. Though rare, you may also have other tests, including:  A urine test.  A test that checks the acid balance in your vagina (acid balance test). How is this treated? Treatment for this condition depends on how severe your symptoms are. Treatment may include:  Using an over-the-counter vaginal lubricant before sex.  Using a long-acting vaginal moisturizer.  REPLENS  Using low-dose vaginal estrogen for moderate to severe symptoms that do not respond to other treatments. Options include creams, tablets, and inserts (vaginal rings). Before you use a vaginal estrogen, tell your health care provider if you have a history of: ? Breast cancer. ? Endometrial cancer. ? Blood clots. If you are not sexually active and your symptoms are very mild, you may not need treatment. Follow these instructions at home: Medicines  Take over-the-counter and prescription medicines only as told by your health care provider. Do not use herbal or alternative medicines unless your health care provider says that you can.  Use over-the-counter creams, lubricants, or moisturizers for dryness only as directed by your health care provider. General instructions  If your atrophic vaginitis is caused by menopause, discuss all of your menopause symptoms  and treatment options with your health care provider.  Do not douche.  Do not use products that can make your vagina dry. These include: ? Scented feminine sprays. ? Scented tampons. ? Scented soaps.  Vaginal intercourse can help to improve blood flow and elasticity of  vaginal tissue. If it hurts to have sex, try using a lubricant or moisturizer just before having intercourse. Contact a health care provider if:  Your discharge looks different than normal.  Your vagina has an unusual smell.  You have new symptoms.  Your symptoms do not improve with treatment.  Your symptoms get worse. Summary  Atrophic vaginitis is a condition in which the tissues that line the vagina become dry and thin. It is most common in women who have stopped having regular menstrual periods (are in menopause).  Treatment options include using vaginal lubricants and low-dose vaginal estrogen.  Contact a health care provider if your vagina has an unusual smell, or if your symptoms get worse or do not improve after treatment. This information is not intended to replace advice given to you by your health care provider. Make sure you discuss any questions you have with your health care provider. Document Revised: 06/06/2017 Document Reviewed: 03/20/2017 Elsevier Patient Education  2020 [employer].

## 2020-03-27 NOTE — Progress Notes (Signed)
HPI:      [name] is a 54 y.o. [id number] who LMP was Patient's last menstrual period was 02/18/2020., she presents today for her annual examination. The patient has no complaints today.  She has had 2 periods this year, last one was mostly spotting.  She has hot flashes, mild.  She has dryness and pain w intercourse. The patient is sexually active. Her last pap: approximate date 2020 and was normal and last mammogram: approximate date 2020 and was normal. The patient does perform self breast exams.  There is notable family history of breast, colon, and pancreatic cancer in her family.  The patient has regular exercise: yes.  The patient denies current symptoms of depression.    PMHx: Past Medical History:  Diagnosis Date  . IBS (irritable bowel syndrome)    Past Surgical History:  Procedure Laterality Date  . BREAST BIOPSY Left 09/2009   benign  . CATARACT EXTRACTION  1998  . DILATION AND CURETTAGE OF UTERUS  2000  . UTERINE SEPTUM RESECTION     Family History  Problem Relation Age of Onset  . Breast cancer Mother 73  . Fibromyalgia Mother   . Hypertension Mother   . Osteoporosis Mother   . Cancer Paternal Uncle        colon  . Heart disease Father   . Heart attack Father   . Stroke Father   . Heart disease Brother   . Hypertension Brother    Social History   Tobacco Use  . Smoking status: Never Smoker  . Smokeless tobacco: Never Used  Vaping Use  . Vaping Use: Never used  Substance Use Topics  . Alcohol use: No    Alcohol/week: 0.0 standard drinks  . Drug use: No    Current Outpatient Medications:  .  ALPRAZolam (XANAX) 0.25 MG tablet, Take 1 tablet (0.25 mg total) by mouth daily as needed for anxiety., Disp: 30 tablet, Rfl: 0 .  cefdinir (OMNICEF) 300 MG capsule, Take 1 capsule (300 mg total) by mouth 2 (two) times daily., Disp: 20 capsule, Rfl: 0 .  PREVIDENT 5000 SENSITIVE 1.1-5 % PSTE, See admin instructions., Disp: , Rfl: 1 .  psyllium (TGT PSYLLIUM  FIBER) 0.52 g capsule, Take by mouth., Disp: , Rfl:  .  terconazole (TERAZOL 7) 0.4 % vaginal cream, Place 1 applicator vaginally at bedtime., Disp: 45 g, Rfl: 1 .  triamcinolone cream (KENALOG) 0.1 %, Apply 1 application topically 2 (two) times daily., Disp: 30 g, Rfl: 1 .  Vaginal Lubricant (REPLENS) GEL, Place 1 Applicatorful vaginally 2 (two) times a week., Disp: 35 g, Rfl: 11 Allergies: Amitriptyline hcl and Other  Review of Systems  Constitutional: Negative for chills, fever and malaise/fatigue.  HENT: Negative for congestion, sinus pain and sore throat.   Eyes: Negative for blurred vision and pain.  Respiratory: Negative for cough and wheezing.   Cardiovascular: Negative for chest pain and leg swelling.  Gastrointestinal: Negative for abdominal pain, constipation, diarrhea, heartburn, nausea and vomiting.  Genitourinary: Negative for dysuria, frequency, hematuria and urgency.  Musculoskeletal: Negative for back pain, joint pain, myalgias and neck pain.  Skin: Negative for itching and rash.  Neurological: Negative for dizziness, tremors and weakness.  Endo/Heme/Allergies: Does not bruise/bleed easily.  Psychiatric/Behavioral: Negative for depression. The patient is not nervous/anxious and does not have insomnia.     Objective: BP (!) 140/98   Ht 5\' 4"  (1.626 m)   Wt 136 lb (61.7 kg)   LMP 02/18/2020  BMI 23.34 kg/m   Filed Weights   03/27/20 1413  Weight: 136 lb (61.7 kg)   Body mass index is 23.34 kg/m. Physical Exam Constitutional:      General: She is not in acute distress.    Appearance: She is well-developed.  Genitourinary:     Pelvic exam was performed with patient supine.     Vagina, uterus and rectum normal.     No lesions in the vagina.     No vaginal bleeding.     No cervical motion tenderness, friability, lesion or polyp.     Uterus is mobile.     Uterus is not enlarged.     No uterine mass detected.    Uterus is retroverted.     Right adnexal mass  present.     No left adnexal mass present.     Right adnexa not tender.     Left adnexa not tender.     Genitourinary Comments: Midline to right ovarian cystic mass (4-6 cm), vs fibroid, as uterus appears retroverted  HENT:     Head: Normocephalic and atraumatic. No laceration.     Right Ear: Hearing normal.     Left Ear: Hearing normal.     Mouth/Throat:     Pharynx: Uvula midline.  Eyes:     Pupils: Pupils are equal, round, and reactive to light.  Neck:     Thyroid: No thyromegaly.  Cardiovascular:     Rate and Rhythm: Normal rate and regular rhythm.     Heart sounds: No murmur heard.  No friction rub. No gallop.   Pulmonary:     Effort: Pulmonary effort is normal. No respiratory distress.     Breath sounds: Normal breath sounds. No wheezing.  Chest:     Breasts:        Right: No mass, skin change or tenderness.        Left: No mass, skin change or tenderness.  Abdominal:     General: Bowel sounds are normal. There is no distension.     Palpations: Abdomen is soft.     Tenderness: There is no abdominal tenderness. There is no rebound.  Musculoskeletal:        General: Normal range of motion.     Cervical back: Normal range of motion and neck supple.  Neurological:     Mental Status: She is alert and oriented to person, place, and time.     Cranial Nerves: No cranial nerve deficit.  Skin:    General: Skin is warm and dry.  Psychiatric:        Judgment: Judgment normal.  Vitals reviewed.     Assessment:  ANNUAL EXAM 1. Women's annual routine gynecological examination   2. Screening for cervical cancer   3. Encounter for screening mammogram for malignant neoplasm of breast   4. Screen for colon cancer   5. Hypercholesterolemia   6. Vaginal atrophy   7. Pelvic mass      Screening Plan:            1.  Cervical Screening-  Pap smear done today  2. Breast screening- Exam annually and mammogram>40 planned   3. Colonoscopy every 10 years, Hemoccult testing -  after age 62  4. Labs reviewed from [facility], done thru work.  Elevated cholesterol.  Plan lifestyle modifications, then recheck 3 mos.  5. Counseling for contraception: bilateral tubal ligation  The pregnancy intention screening data noted above was reviewed. Potential methods of contraception were discussed.  The patient elected to proceed with Female Sterilization.   ` 6. Vaginal atrophy - Options discusssed for management - Vaginal Lubricant (REPLENS) GEL; Place 1 Applicatorful vaginally 2 (two) times a week.  Dispense: 35 g; Refill: 11   7. Pelvic mass No sx's, will assess firs by [place] - US PELVIC COMPLETE WITH TRANSVAGINAL; Future      F/U  Return in about 1 week (around 04/03/2020) for Follow up w GYN [place].  Annamarie Major, MD, [facility], [facility] 03/27/2020  3:19 PM

## 2020-03-29 LAB — CYTOLOGY - PAP: Diagnosis: NEGATIVE

## 2020-04-03 ENCOUNTER — Ambulatory Visit
Admission: RE | Admit: 2020-04-03 | Discharge: 2020-04-03 | Disposition: A | Discharge status: Home / Self Care | Payer: Managed Care, Other (non HMO) | Source: Ambulatory Visit | Attending: Obstetrics & Gynecology | Admitting: Obstetrics & Gynecology

## 2020-04-03 ENCOUNTER — Other Ambulatory Visit: Payer: Self-pay

## 2020-04-03 DIAGNOSIS — Z1231 Encounter for screening mammogram for malignant neoplasm of breast: Secondary | ICD-10-CM

## 2020-04-10 ENCOUNTER — Ambulatory Visit (INDEPENDENT_AMBULATORY_CARE_PROVIDER_SITE_OTHER): Payer: Managed Care, Other (non HMO) | Admitting: Obstetrics & Gynecology

## 2020-04-10 ENCOUNTER — Other Ambulatory Visit: Payer: Self-pay | Admitting: Obstetrics & Gynecology

## 2020-04-10 ENCOUNTER — Encounter: Payer: Self-pay | Admitting: Obstetrics & Gynecology

## 2020-04-10 ENCOUNTER — Ambulatory Visit (INDEPENDENT_AMBULATORY_CARE_PROVIDER_SITE_OTHER): Payer: Managed Care, Other (non HMO)

## 2020-04-10 ENCOUNTER — Other Ambulatory Visit: Payer: Self-pay

## 2020-04-10 VITALS — BP 140/90 | Ht 64.0 in | Wt 134.0 lb

## 2020-04-10 DIAGNOSIS — R1900 Intra-abdominal and pelvic swelling, mass and lump, unspecified site: Secondary | ICD-10-CM

## 2020-04-10 DIAGNOSIS — E7800 Pure hypercholesterolemia, unspecified: Secondary | ICD-10-CM

## 2020-04-10 NOTE — Progress Notes (Signed)
  HPI: Pt seen today as she had what felt like a pelvic mass on exam last visit.  She says she occas has some RLQ mild pain like [place] but not recently.  No bloating or weight changes.  No bleeding since August, which was light.  Ultrasound demonstrates no masses seen, free fluid small amount (may be from cyst rupture)  PMHx: She  has a past medical history of IBS (irritable bowel syndrome). Also,  has a past surgical history that includes Dilation and curettage of uterus (2000); Cataract extraction (1998); Uterine septum resection; and Breast biopsy (Left, 09/2009)., family history includes Breast cancer (age of onset: 22) in her mother; Cancer in her paternal uncle; Fibromyalgia in her mother; Heart attack in her father; Heart disease in her brother and father; Hypertension in her brother and mother; Osteoporosis in her mother; Stroke in her father.,  reports that she has never smoked. She has never used smokeless tobacco. She reports that she does not drink alcohol and does not use drugs.  She has a current medication list which includes the following prescription(s): alprazolam, cefdinir, prevident 5000 sensitive, psyllium, terconazole, triamcinolone cream, and replens. Also, is allergic to amitriptyline hcl and other.  Review of Systems  All other systems reviewed and are negative.   Objective: BP 140/90   Ht 5\' 4"  (1.626 m)   Wt 134 lb (60.8 kg)   BMI 23.00 kg/m   Physical examination Constitutional NAD, Conversant  Skin No rashes, lesions or ulceration.   Extremities: Moves all appropriately.  Normal ROM for age. No lymphadenopathy.  Neuro: Grossly intact  Psych: Oriented to PPT.  Normal mood. Normal affect.   PELVIS TRANSVAGINAL NON-OB (TV ONLY)  Result Date: 04/10/2020 Patient Name: Carol Francis DOB: December 17, 1965 MRN: [id number] ULTRASOUND REPORT Location: [facility] Date of Service: 04/10/2020 Indications:R/O a pelvic mass Findings: The uterus is retroverted and  measures 6.5 x 4.8 x 3.9 cm. Echo texture is heterogenous without evidence of focal masses. The Endometrium measures 3.5 mm. Right Ovary measures 2.1 x 1.9 x 1.0 cm. It is normal in appearance. Left Ovary measures 3.2 x 1.8 x 1.2 cm. It is normal in appearance. Survey of the adnexa demonstrates no adnexal masses. There is a small amount of free fluid in the posterior cul-de-sac measuring 38.2 x 28.4 x 38.3 mm. Impression: 1. Normal pelvic ultrasound. Recommendations: 1.Clinical correlation with the patient's History and Physical Exam. 06/10/2020, RT Review of ULTRASOUND.    I have personally reviewed images and report of recent ultrasound done at [facility].    Plan of management to be discussed with patient. [facility], MD, FACOG [facility], [facility] 04/10/2020  10:33 AM   Assessment:  Pelvic mass No immediate follow up planned, as she has no sx's of concern and no 06/10/2020 findings of concern  Hypercholesterolemia  - Plan: Lipid panel  A total of 20 minutes were spent face-to-face with the patient as well as preparation, review, communication, and documentation during this encounter.   [place], MD, [facility], [facility] 04/10/2020  10:42 AM

## 2020-05-28 IMAGING — MG MM DIGITAL SCREENING BILAT W/ TOMO W/ CAD
8 series · 9 of 24 positions shown · non-contrast
Comparison: Previous exam(s).

CLINICAL DATA: Screening.

EXAM:
DIGITAL SCREENING BILATERAL MAMMOGRAM WITH TOMO AND CAD

[L CC synth-2D]
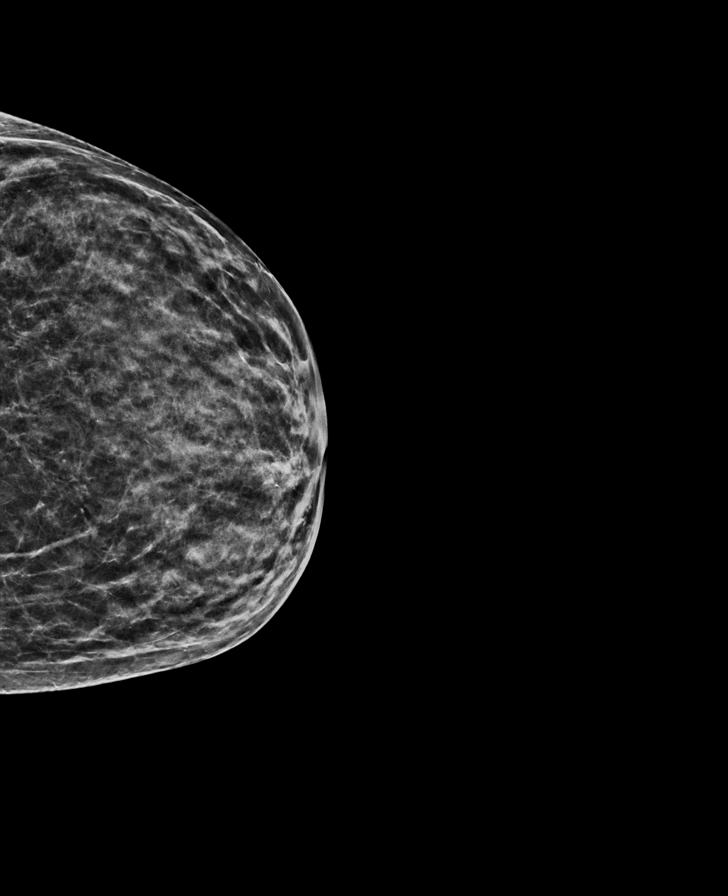

[R CC synth-2D]
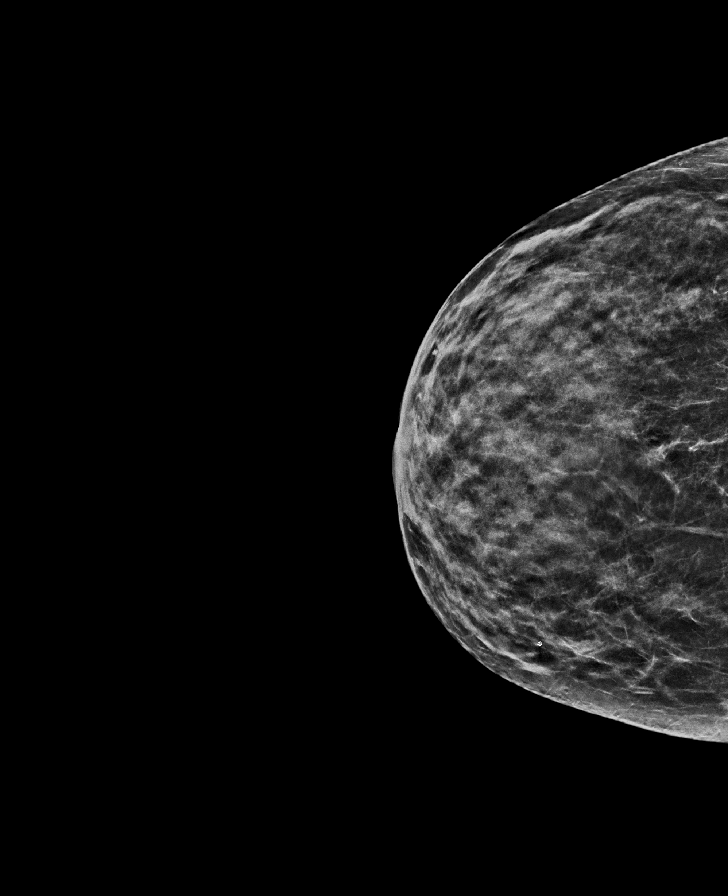

[L MLO synth-2D]
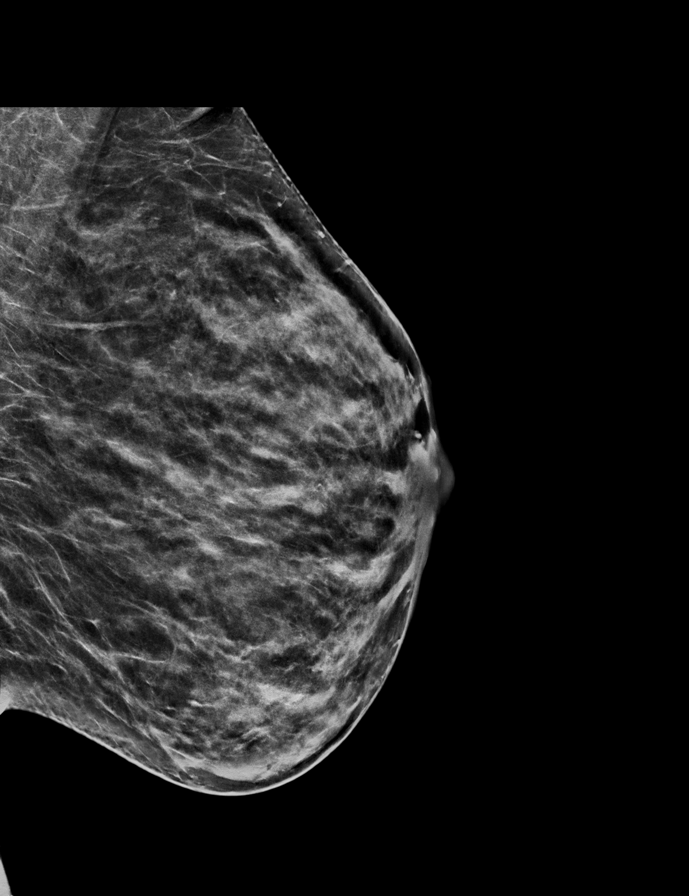

[R MLO synth-2D]
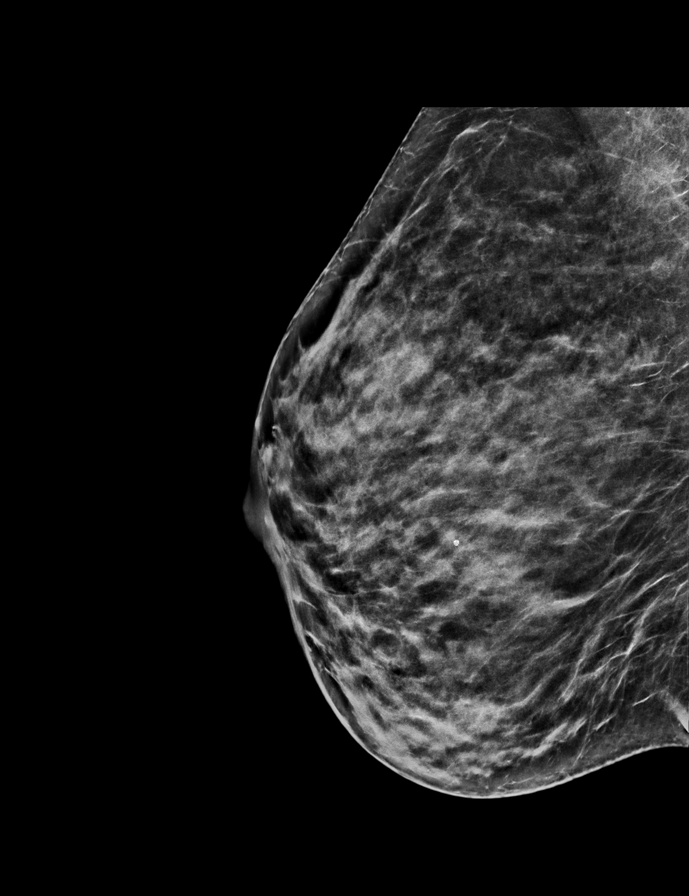

[L MLO tomo · 2 of 60 frames shown]
[frame 20/60]
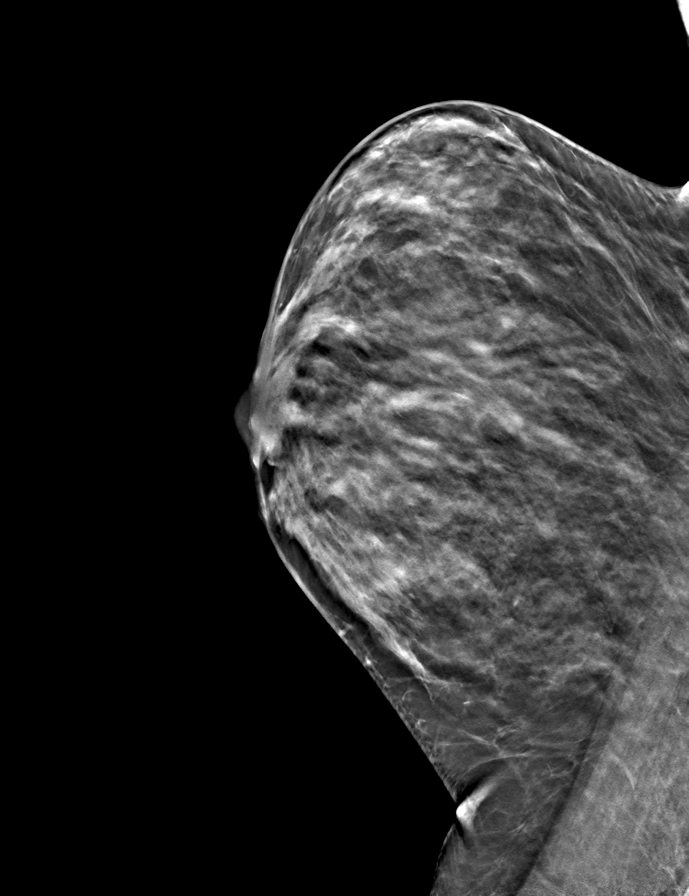
[frame 31/60]
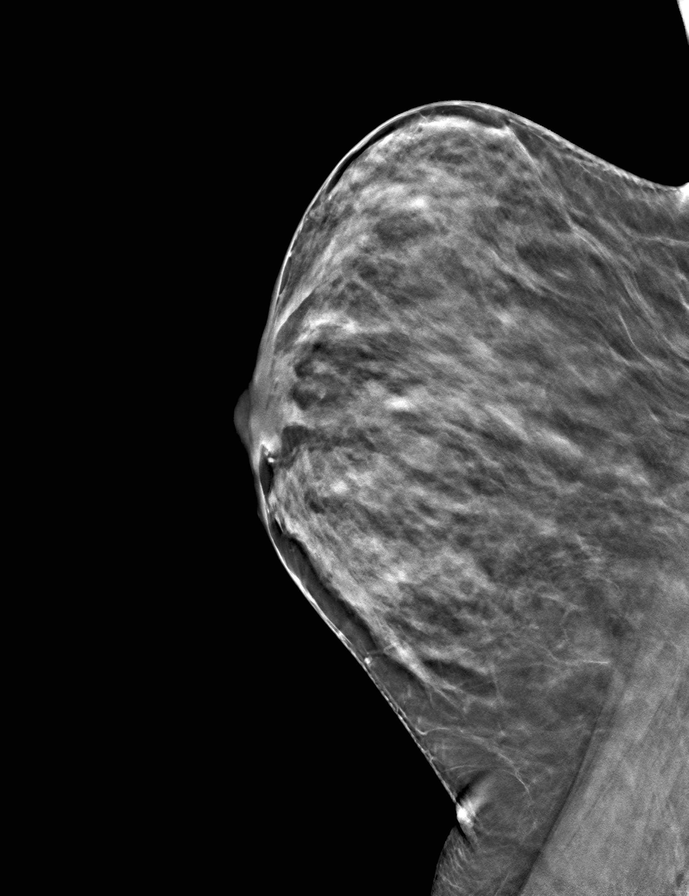

[R MLO tomo · tomo slice 31/61.0]
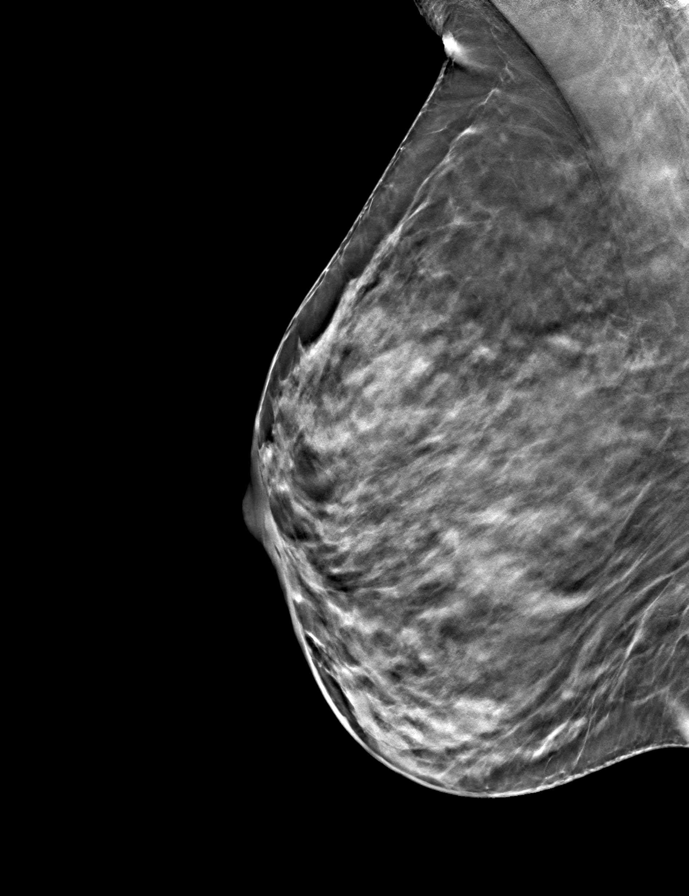

[R CC tomo · tomo slice 29/57.0]
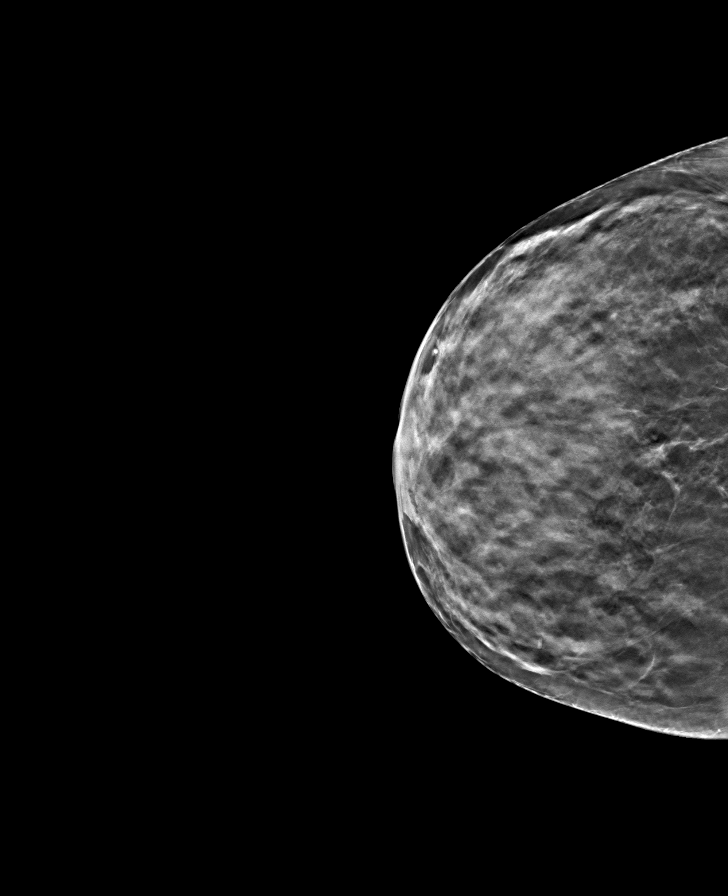

[L CC tomo · tomo slice 27/54.0]
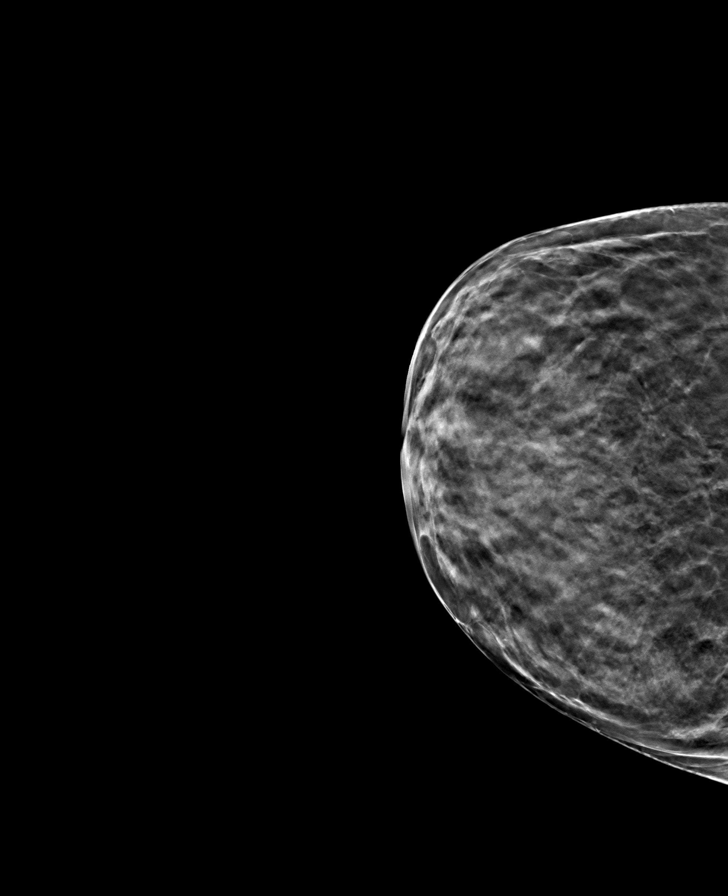

[9 of 24 positions shown; findings below may reference images not displayed]

ACR Breast Density Category c: The breast tissue is heterogeneously
dense, which may obscure small masses.
FINDINGS: There are no findings suspicious for malignancy. Images were
processed with CAD.
IMPRESSION: No mammographic evidence of malignancy. A result letter of this
screening mammogram will be mailed directly to the patient.

RECOMMENDATION:
Screening mammogram in one year. (Code:FT-U-LHB)

BI-RADS CATEGORY  1: Negative.

## 2021-03-30 ENCOUNTER — Encounter: Payer: Self-pay | Admitting: Obstetrics & Gynecology

## 2021-03-30 ENCOUNTER — Ambulatory Visit (INDEPENDENT_AMBULATORY_CARE_PROVIDER_SITE_OTHER): Payer: Managed Care, Other (non HMO) | Admitting: Obstetrics & Gynecology

## 2021-03-30 ENCOUNTER — Other Ambulatory Visit: Payer: Self-pay

## 2021-03-30 VITALS — BP 122/80 | Ht 64.0 in | Wt 144.0 lb

## 2021-03-30 DIAGNOSIS — Z124 Encounter for screening for malignant neoplasm of cervix: Secondary | ICD-10-CM

## 2021-03-30 DIAGNOSIS — N952 Postmenopausal atrophic vaginitis: Secondary | ICD-10-CM | POA: Diagnosis not present

## 2021-03-30 DIAGNOSIS — Z01419 Encounter for gynecological examination (general) (routine) without abnormal findings: Secondary | ICD-10-CM

## 2021-03-30 DIAGNOSIS — Z1231 Encounter for screening mammogram for malignant neoplasm of breast: Secondary | ICD-10-CM | POA: Diagnosis not present

## 2021-03-30 MED ORDER — REPLENS VA GEL: 1.0000 | VAGINAL | 11 refills | Status: AC

## 2021-03-30 MED ORDER — TRIAMCINOLONE ACETONIDE 0.1 % EX CREA: 1.0000 "application " | TOPICAL_CREAM | Freq: Two times a day (BID) | CUTANEOUS | 1 refills | Status: DC

## 2021-03-30 NOTE — Patient Instructions (Addendum)
The  results of your cholesterol testing reveals higher than normal levels.  Recommendations include keeping total cholesterol below 200, LDL below 130 or even 100 when additional risk factor are present such as diabetes or heart disease, and HDL above 50.  Options include over the counter supplements or prescription therapy.  Based on your levels, I recommend we start with adding either Omega-3 (DHA or fish oil) supplement or Red Yeast Rice or Niacin as a natural alternative to reduce high cholesterol levels.  We should recheck levels next year, or sooner (no sooner than 3 months) as desired.  I reserve prescription therapy for severe or refractory cases, and always involve an internal medicine specialist in those cases.    Recommendations to boost your immunity to prevent illness such as viral flu and colds, including covid19, are as follows:       - - -  Vitamin K2 and Vitamin D3  - - - Take Vitamin K2 at 200-300 mcg daily (usually 2-3 pills daily of the over the counter formulation). Take Vitamin D3 at 3000-4000 U daily (usually 3-4 pills daily of the over the counter formulation). Studies show that these two at high normal levels in your system are very effective in keeping your immunity so strong and protective that you will be unlikely to contract viral illness such as those listed above.  Replens for vaginal dryness, twice weekly  Black Cohosh as main herbal ingredient for hot flashes and other menopause symptoms  Dr Tiburcio Pea

## 2021-03-30 NOTE — Progress Notes (Signed)
HPI:      [name] is a 55 y.o. [id number] who LMP was in the past, she presents today for her annual examination.  The patient has no complaints today. The patient is sexually active. Herlast pap: approximate date 2021 and was normal and last mammogram: was normal.  The patient does perform self breast exams.  There is no notable family history of breast or ovarian cancer in her family. The patient is not taking hormone replacement therapy. Patient denies post-menopausal vaginal bleeding.   The patient has regular exercise: yes. The patient denies current symptoms of depression.    GYN Hx: Last Colonoscopy:2 years ago. Normal.  Last DEXA:  never  ago.    PMHx: Past Medical History:  Diagnosis Date   IBS (irritable bowel syndrome)    Past Surgical History:  Procedure Laterality Date   BREAST BIOPSY Left 09/2009   benign   CATARACT EXTRACTION  1998   DILATION AND CURETTAGE OF UTERUS  2000   UTERINE SEPTUM RESECTION     Family History  Problem Relation Age of Onset   Breast cancer Mother 47   Fibromyalgia Mother    Hypertension Mother    Osteoporosis Mother    Pancreatic cancer Mother 100   Heart disease Father    Heart attack Father    Stroke Father    Heart disease Brother    Hypertension Brother    Cancer Paternal Uncle        colon   Social History   Tobacco Use   Smoking status: Never   Smokeless tobacco: Never  Vaping Use   Vaping Use: Never used  Substance Use Topics   Alcohol use: No    Alcohol/week: 0.0 standard drinks   Drug use: No    Current Outpatient Medications:    cefdinir (OMNICEF) 300 MG capsule, Take 1 capsule (300 mg total) by mouth 2 (two) times daily., Disp: 20 capsule, Rfl: 0   PREVIDENT 5000 SENSITIVE 1.1-5 % PSTE, See admin instructions., Disp: , Rfl: 1   psyllium (REGULOID) 0.52 g capsule, Take by mouth., Disp: , Rfl:    triamcinolone cream (KENALOG) 0.1 %, Apply 1 application topically 2 (two) times daily., Disp: 30 g, Rfl: 1    [START ON 04/02/2021] Vaginal Lubricant (REPLENS) GEL, Place 1 Applicatorful vaginally 2 (two) times a week., Disp: 35 g, Rfl: 11 Allergies: Amitriptyline hcl and Other  Review of Systems  Constitutional:  Negative for chills, fever and malaise/fatigue.  HENT:  Negative for congestion, sinus pain and sore throat.   Eyes:  Negative for blurred vision and pain.  Respiratory:  Negative for cough and wheezing.   Cardiovascular:  Negative for chest pain and leg swelling.  Gastrointestinal:  Negative for abdominal pain, constipation, diarrhea, heartburn, nausea and vomiting.  Genitourinary:  Negative for dysuria, frequency, hematuria and urgency.  Musculoskeletal:  Positive for joint pain. Negative for back pain, myalgias and neck pain.  Skin:  Positive for rash. Negative for itching.  Neurological:  Negative for dizziness, tremors and weakness.  Endo/Heme/Allergies:  Does not bruise/bleed easily.  Psychiatric/Behavioral:  Negative for depression. The patient is not nervous/anxious and does not have insomnia.    Objective: BP 122/80   Ht 5\' 4"  (1.626 m)   Wt 144 lb (65.3 kg)   BMI 24.72 kg/m   Filed Weights   03/30/21 1512  Weight: 144 lb (65.3 kg)   Body mass index is 24.72 kg/m. Physical Exam Constitutional:  General: She is not in acute distress.    Appearance: She is well-developed.  Genitourinary:     Bladder, rectum and urethral meatus normal.     No lesions in the vagina.     Right Labia: No rash, tenderness or lesions.    Left Labia: No tenderness, lesions or rash.    No vaginal bleeding.      Right Adnexa: not tender and no mass present.    Left Adnexa: not tender and no mass present.    No cervical motion tenderness, friability, lesion or polyp.     Uterus is not enlarged.     No uterine mass detected.    Pelvic exam was performed with patient in the lithotomy position.  Breasts:    Right: No mass, skin change or tenderness.     Left: No mass, skin change or  tenderness.  HENT:     Head: Normocephalic and atraumatic. No laceration.     Right Ear: Hearing normal.     Left Ear: Hearing normal.     Mouth/Throat:     Pharynx: Uvula midline.  Eyes:     Pupils: Pupils are equal, round, and reactive to light.  Neck:     Thyroid: No thyromegaly.  Cardiovascular:     Rate and Rhythm: Normal rate and regular rhythm.     Heart sounds: No murmur heard.   No friction rub. No gallop.  Pulmonary:     Effort: Pulmonary effort is normal. No respiratory distress.     Breath sounds: Normal breath sounds. No wheezing.  Abdominal:     General: Bowel sounds are normal. There is no distension.     Palpations: Abdomen is soft.     Tenderness: There is no abdominal tenderness. There is no rebound.  Musculoskeletal:        General: Normal range of motion.     Cervical back: Normal range of motion and neck supple.  Neurological:     Mental Status: She is alert and oriented to person, place, and time.     Cranial Nerves: No cranial nerve deficit.  Skin:    General: Skin is warm and dry.  Psychiatric:        Judgment: Judgment normal.  Vitals reviewed.    Assessment: Annual Exam 1. Women's annual routine gynecological examination   2. Screening for cervical cancer   3. Encounter for screening mammogram for malignant neoplasm of breast   4. Vaginal atrophy     Plan:            1.  Cervical Screening-  Pap smear done today Pt desires yearly  2. Breast screening- Exam annually and mammogram scheduled  3. Colonoscopy every 5 years, Hemoccult testing after age 85  4. Labs  reviewed from work, cholesterol still high, discussed options w herbal supplements  5. Counseling for hormonal therapy: none desired, but will do herbal products to try to help w menopause sx's.  Black Cohosh discussed.              6. Vag dryness - Replens discussed as option   F/U  Return in about 1 year (around 03/30/2022) for Annual.  Annamarie Major, MD, [facility],  [facility] 03/30/2021  4:03 PM  d

## 2021-04-04 LAB — PAP IG (IMAGE GUIDED)

## 2021-04-17 ENCOUNTER — Other Ambulatory Visit: Payer: Self-pay

## 2021-04-17 ENCOUNTER — Ambulatory Visit
Admission: RE | Admit: 2021-04-17 | Discharge: 2021-04-17 | Disposition: A | Discharge status: Home / Self Care | Payer: Managed Care, Other (non HMO) | Source: Ambulatory Visit | Attending: Obstetrics & Gynecology | Admitting: Obstetrics & Gynecology

## 2021-04-17 DIAGNOSIS — Z1231 Encounter for screening mammogram for malignant neoplasm of breast: Secondary | ICD-10-CM | POA: Insufficient documentation

## 2021-04-30 ENCOUNTER — Encounter: Payer: Self-pay | Admitting: Obstetrics and Gynecology

## 2022-04-01 ENCOUNTER — Encounter: Payer: Self-pay | Admitting: Obstetrics & Gynecology

## 2022-04-01 ENCOUNTER — Ambulatory Visit (INDEPENDENT_AMBULATORY_CARE_PROVIDER_SITE_OTHER): Payer: Managed Care, Other (non HMO) | Admitting: Obstetrics & Gynecology

## 2022-04-01 VITALS — BP 104/80 | HR 71 | Resp 16 | Ht 64.0 in | Wt 134.4 lb

## 2022-04-01 DIAGNOSIS — B369 Superficial mycosis, unspecified: Secondary | ICD-10-CM | POA: Diagnosis not present

## 2022-04-01 DIAGNOSIS — Z124 Encounter for screening for malignant neoplasm of cervix: Secondary | ICD-10-CM

## 2022-04-01 MED ORDER — NYSTATIN 100000 UNIT/GM EX CREA: 1.0000 | TOPICAL_CREAM | Freq: Two times a day (BID) | CUTANEOUS | 3 refills | Status: AC

## 2022-04-01 NOTE — Progress Notes (Addendum)
Subjective:    Patient 57 y/o    here for a routine  GYN exam.  Current complaints: none.  Personal health questionnaire reviewed: yes.   Gynecologic History Patient's last menstrual period was 02/18/2020. Contraception: menopausal Last Pap: 2022. Results were: normal Last mammogram: 04-2020. Results were: normal  Obstetric History OB History  Gravida Para Term Preterm AB Living  6 2     4 2   SAB IAB Ectopic Multiple Live Births  2   2   2     # Outcome Date GA Lbr Len/2nd Weight Sex Delivery Anes PTL Lv  6 SAB           5 SAB           4 Ectopic           3 Ectopic           2 Para           1 Para             Obstetric Comments  1st Menstrual Cycle:  17  1st Pregnancy:  30       The following portions of the patient's history were reviewed and updated as appropriate: allergies, current medications, past family history, past medical history, past social history, past surgical history, and problem list.  Review of Systems A comprehensive review of systems was negative.    Objective:    BP 104/80   Pulse 71   Resp 16   Ht 5\' 4"  (1.626 m)   Wt 134 lb 6.4 oz (61 kg)   LMP 02/18/2020   SpO2 99%   BMI 23.07 kg/m  General appearance: alert, cooperative, and no distress Heart: regular rate and rhythm Abdomen: normal findings: soft, non-tender Pelvic: cervix normal in appearance, external genitalia normal, no adnexal masses or tenderness, no cervical motion tenderness, rectovaginal septum normal, uterus normal size, shape, and consistency, and vagina normal without discharge Extremities: extremities normal, atraumatic, no cyanosis or edema Skin: Skin color, texture, turgor normal. No rashes or lesions    Assessment:    Healthy female exam.   Hx of skin fungus Plan:    Follow up in: 1 year.  Added  Mammogram   Rosario Adie, MD  04/02/2022 3:44 PM

## 2022-04-02 NOTE — Addendum Note (Signed)
Addended by: Rosario Adie on: 04/02/2022 03:53 PM   Modules accepted: Orders

## 2022-04-03 LAB — PAP IG (IMAGE GUIDED)

## 2022-04-09 ENCOUNTER — Telehealth: Payer: Self-pay

## 2022-04-09 NOTE — Telephone Encounter (Signed)
Patient is calling regarding her office visit progress note. It states she has had a hysterectomy, and the patient has not ever had a hysterectomy. It says that in 2 different places in the note. Pt is wanting this fixed ASAP and wants a call when fixed. Please see that this is fixed. Thank you.

## 2022-04-11 MED ORDER — TRIAMCINOLONE ACETONIDE 0.5 % EX CREA: TOPICAL_CREAM | Freq: Two times a day (BID) | CUTANEOUS | Status: AC

## 2022-04-11 NOTE — Telephone Encounter (Signed)
I called the pt Carol Francis to notify her that I took the word hysterectomy out of her note.  The pt has never had a hysterectomy.  After correcting her visit note, the pt informed me that she had run out of Triamcinolone.  I sent a refill to the patient's pharmacy.    Rosario Adie, MD  04/11/2022 2:22 PM

## 2022-04-11 NOTE — Addendum Note (Signed)
Addended by: Rosario Adie on: 04/11/2022 02:14 PM   Modules accepted: Orders

## 2022-05-07 ENCOUNTER — Ambulatory Visit
Admission: RE | Admit: 2022-05-07 | Discharge: 2022-05-07 | Disposition: A | Discharge status: Home / Self Care | Payer: Managed Care, Other (non HMO) | Source: Ambulatory Visit | Attending: Obstetrics & Gynecology | Admitting: Obstetrics & Gynecology

## 2022-05-07 DIAGNOSIS — Z124 Encounter for screening for malignant neoplasm of cervix: Secondary | ICD-10-CM | POA: Insufficient documentation

## 2022-05-07 DIAGNOSIS — Z1231 Encounter for screening mammogram for malignant neoplasm of breast: Secondary | ICD-10-CM | POA: Diagnosis not present

## 2022-05-10 ENCOUNTER — Other Ambulatory Visit: Payer: Self-pay

## 2022-05-10 DIAGNOSIS — B369 Superficial mycosis, unspecified: Secondary | ICD-10-CM

## 2022-05-10 MED ORDER — TRIAMCINOLONE ACETONIDE 0.1 % EX CREA: 1.0000 | TOPICAL_CREAM | Freq: Two times a day (BID) | CUTANEOUS | 1 refills | Status: AC

## 2022-08-01 ENCOUNTER — Telehealth: Payer: Self-pay | Admitting: Obstetrics & Gynecology

## 2022-08-01 NOTE — Telephone Encounter (Signed)
Patient is aware.

## 2022-08-01 NOTE — Telephone Encounter (Signed)
Left message for patient to return my call. If patient calls back, please let the patient know that I heard back from the billing department and DOS 04/01/22 has been corrected, and she has a zero balance for that visit.

## 2023-03-14 ENCOUNTER — Other Ambulatory Visit: Payer: Self-pay | Admitting: Family Medicine

## 2023-03-14 DIAGNOSIS — Z1231 Encounter for screening mammogram for malignant neoplasm of breast: Secondary | ICD-10-CM

## 2023-03-17 ENCOUNTER — Other Ambulatory Visit: Payer: Self-pay | Admitting: Family Medicine

## 2023-03-17 DIAGNOSIS — E785 Hyperlipidemia, unspecified: Secondary | ICD-10-CM

## 2023-03-21 ENCOUNTER — Ambulatory Visit
Admission: RE | Admit: 2023-03-21 | Discharge: 2023-03-21 | Disposition: A | Discharge status: Home / Self Care | Payer: Managed Care, Other (non HMO) | Source: Ambulatory Visit | Attending: Family Medicine | Admitting: Family Medicine

## 2023-03-21 DIAGNOSIS — E785 Hyperlipidemia, unspecified: Secondary | ICD-10-CM | POA: Insufficient documentation

## 2023-05-16 ENCOUNTER — Ambulatory Visit
Admission: RE | Admit: 2023-05-16 | Discharge: 2023-05-16 | Disposition: A | Discharge status: Home / Self Care | Payer: Managed Care, Other (non HMO) | Source: Ambulatory Visit | Attending: Family Medicine | Admitting: Family Medicine

## 2023-05-16 DIAGNOSIS — Z1231 Encounter for screening mammogram for malignant neoplasm of breast: Secondary | ICD-10-CM | POA: Insufficient documentation

## 2023-05-22 ENCOUNTER — Other Ambulatory Visit: Payer: Self-pay | Admitting: Family Medicine

## 2023-05-22 DIAGNOSIS — R928 Other abnormal and inconclusive findings on diagnostic imaging of breast: Secondary | ICD-10-CM

## 2023-05-23 ENCOUNTER — Ambulatory Visit
Admission: RE | Admit: 2023-05-23 | Discharge: 2023-05-23 | Disposition: A | Discharge status: Home / Self Care | Payer: Managed Care, Other (non HMO) | Source: Ambulatory Visit | Attending: Family Medicine | Admitting: Family Medicine

## 2023-05-23 DIAGNOSIS — R928 Other abnormal and inconclusive findings on diagnostic imaging of breast: Secondary | ICD-10-CM

## 2024-03-19 ENCOUNTER — Other Ambulatory Visit: Payer: Self-pay | Admitting: Family Medicine

## 2024-03-19 DIAGNOSIS — Z1231 Encounter for screening mammogram for malignant neoplasm of breast: Secondary | ICD-10-CM

## 2024-05-19 ENCOUNTER — Ambulatory Visit
Admission: RE | Admit: 2024-05-19 | Discharge: 2024-05-19 | Disposition: A | Discharge status: Home / Self Care | Source: Ambulatory Visit | Attending: Family Medicine | Admitting: Family Medicine

## 2024-05-19 DIAGNOSIS — Z1231 Encounter for screening mammogram for malignant neoplasm of breast: Secondary | ICD-10-CM | POA: Insufficient documentation
